# Patient Record
Sex: Female | Born: 1960
Health system: Southern US, Community
[De-identification: ages and names within clinical notes are randomized; demographics above are authoritative.]

## PROBLEM LIST (undated history)

## (undated) DIAGNOSIS — G4730 Sleep apnea, unspecified: Secondary | ICD-10-CM

## (undated) DIAGNOSIS — Z8041 Family history of malignant neoplasm of ovary: Secondary | ICD-10-CM

## (undated) DIAGNOSIS — K589 Irritable bowel syndrome without diarrhea: Secondary | ICD-10-CM

## (undated) DIAGNOSIS — G2581 Restless legs syndrome: Secondary | ICD-10-CM

## (undated) DIAGNOSIS — I803 Phlebitis and thrombophlebitis of lower extremities, unspecified: Secondary | ICD-10-CM

## (undated) DIAGNOSIS — E739 Lactose intolerance, unspecified: Secondary | ICD-10-CM

## (undated) DIAGNOSIS — E7800 Pure hypercholesterolemia, unspecified: Secondary | ICD-10-CM

## (undated) DIAGNOSIS — G43909 Migraine, unspecified, not intractable, without status migrainosus: Secondary | ICD-10-CM

## (undated) DIAGNOSIS — E785 Hyperlipidemia, unspecified: Secondary | ICD-10-CM

## (undated) DIAGNOSIS — Z86718 Personal history of other venous thrombosis and embolism: Secondary | ICD-10-CM

## (undated) HISTORY — DX: Restless legs syndrome: G25.81

## (undated) HISTORY — DX: Family history of malignant neoplasm of ovary: Z80.41

## (undated) HISTORY — DX: Sleep apnea, unspecified: G47.30

## (undated) HISTORY — PX: APPENDECTOMY: SHX54

## (undated) HISTORY — DX: Irritable bowel syndrome, unspecified: K58.9

## (undated) HISTORY — DX: Migraine, unspecified, not intractable, without status migrainosus: G43.909

## (undated) HISTORY — PX: TUBAL LIGATION: SHX77

## (undated) HISTORY — DX: Phlebitis and thrombophlebitis of lower extremities, unspecified: I80.3

## (undated) HISTORY — DX: Personal history of other venous thrombosis and embolism: Z86.718

## (undated) HISTORY — DX: Lactose intolerance, unspecified: E73.9

## (undated) HISTORY — DX: Pure hypercholesterolemia, unspecified: E78.00

## (undated) HISTORY — PX: CHOLECYSTECTOMY: SHX55

## (undated) HISTORY — PX: ABDOMINAL HYSTERECTOMY: SHX81

## (undated) HISTORY — DX: Hyperlipidemia, unspecified: E78.5

---

## 2008-01-12 ENCOUNTER — Ambulatory Visit: Payer: Self-pay | Admitting: Cardiology

## 2008-01-13 ENCOUNTER — Encounter: Payer: Self-pay | Admitting: Cardiology

## 2008-01-13 LAB — CONVERTED CEMR LAB
Hemoglobin, Urine: NEGATIVE
Leukocytes, UA: NEGATIVE
Protein, ur: NEGATIVE mg/dL
Urine Glucose: NEGATIVE mg/dL
Urobilinogen, UA: 0.2 (ref 0.0–1.0)

## 2008-01-16 ENCOUNTER — Ambulatory Visit: Payer: Self-pay | Admitting: Cardiology

## 2008-01-20 ENCOUNTER — Ambulatory Visit: Payer: Self-pay | Admitting: Cardiology

## 2008-02-10 ENCOUNTER — Ambulatory Visit: Payer: Self-pay | Admitting: Cardiology

## 2010-08-05 NOTE — Assessment & Plan Note (Signed)
[name] [name] OFFICE NOTE   [name], [name]                        MRN:          [id number]  DATE:02/10/2008                            DOB:          16-Apr-1960    Primary Care Physician: [name] Mink, MD   HISTORY OF PRESENT ILLNESS:  This is a 50 year old patient of [name] [name]  with a history of sleep apnea and hypercholesterolemia, who presents for  evaluation of an abnormal EKG and history of pedal edema.  The patient  states that back in August 2009, she started developing swelling in her  feet and her ankles, and she saw [name] [name] about this and premature  beats were noted on exam.  Her EKG showed PVCs and PAC.  An echo was  done, the echo was fairly unremarkable with normal LV systolic function  and no major valvular dysfunction.  The patient do not actually feel any  palpitations.  It was also noted on her EKG that she had a relatively  short PR interval and I did see the patient in consultation for this  about a month ago.  Initially, I noted that she did have a short PR  interval on her EKG, however, there is no evidence for pre-excitation.  She has no history of lightheadedness, syncope, or palpitations.  She  has good exercise tolerance, able to walk on flat ground, walk up hills  as well as climb flights of steps with no shortness of breath.  She has  had no chest pain.  We did do a Holter monitor that showed a brief  episode of ventricular bigeminy, otherwise just rare PVCs and PACs.  We  did an exercise treadmill test.  The patient did have excellent exercise  tolerance.  She walked for 10 minutes and 2 seconds, stopped due to  fatigue, and no arrhythmias were noted.  There were no ischemic ST  segment changes.  Since I last her, the patient states that she has been  trying to cut back on her salt and she has actually been able to stop  the Lasix and has had no further leg swelling.  She did not  even need to  start the compression stockings.  Additionally, she has been avoiding  caffeine.   PAST MEDICAL HISTORY:  1. Hypercholesterolemia.  2. Mild obesity.  3. Obstructive sleep apnea.  The patient does have CPAP, however, she      seems to be using only intermittently.  4. Restless leg syndrome.  The patient is on Mirapex.  5. Echo done in August 2009 at Dr. Marliss [name] office that shows EF of      60%, trace mitral regurgitation, trace tricuspid regurgitation,      normal pulmonary artery pressures, normal LV wall motion, no aortic      stenosis.  6. Holter monitor done in October 2009, that showed a brief episode of      ventricular bigeminy, and otherwise rare PVCs and PACs.  7. Exercise treadmill test on January 20, 2008.  ECG showed normal  sinus rhythm with relatively short PR interval.  The patient      exercised for 10 minutes and 2 seconds.  She had good exercise      tolerance and there were no ST-segment changes and no arrhythmias      developed.   Most recent labs from October 2009, show a BNP of 27.   PHYSICAL EXAMINATION:  VITAL SIGNS:  Blood pressure 160, heart rate is  68 and regular, weight is 176 pounds.  GENERAL:  This is a well-developed female in no apparent distress.  NEUROLOGIC:  Alert and oriented x3.  Normal affect.  NECK:  No thyromegaly.  No thyroid nodule.  No JVD.  CARDIOVASCULAR:  Heart regular S1 and S2.  No S3, no S4.  No murmur.  EXTREMITIES:  There is no carotid bruit.  There is no peripheral edema.  There are 2+ dorsalis pedis pulses bilaterally.  ABDOMEN:  Soft and  nontender.  No hepatosplenomegaly.  Normal bowel sounds.   ASSESSMENT AND PLAN:  This is a 50 year old with history of sleep apnea,  hypercholesterolemia, who presented to Cardiology Clinic for evaluation  of an abnormal EKG and lower extremity swelling.  1. Rhythm.  The patient does have a relatively short PR interval on      her EKG.  However, there is no discrete delta  wave and she has had      no episodes of syncope, palpitations, or lightheadedness.      Therefore, I think is unlikely to be Wolff-Parkinson-White      syndrome.  Additionally, the patient had an exercise treadmill test      on which she had good exercise tolerance, had no ischemic changes,      and also developed no arrhythmias.  She did have a Holter monitor      showing a brief ventricular bigeminy as well as some rare premature      ventricular contractions and premature atrial contractions.  There      is really no specific intervention that needs to be taken for this      especially in the setting of a normal left ventricular function on      echocardiography.  I think, at this time, the best thing would be      to avoid caffeine as much as possible as she is doing.  She also      did, of note, have a normal TSH done back in August 2009.  Finally,      the patient's BNP was also low at 27.  2. Peripheral edema.  The patient's peripheral edema has actually      completely resolved.  She is not using any Lasix.  She did have an      echo showing normal left ventricular ejection fraction.  There are      no significant valvular abnormalities, and she did not have      pulmonary hypertension.  Her BNP was 27 suggesting that there is no      volume overload.  I think it may have been just venous      insufficiency that causied the patient's ankle swelling.  She has      been cutting back on the salt in her diet.  If needed in the      future, she can use graded compression stockings.  3. History of hyperlipidemia.  We will try to get faxed over the most      recent lipids done  at Dr. Marliss [name] office back in August to see if      there is any further intervention needed with that.     [name] Ancona, MD  Electronically Signed    DM/MedQ  DD: 02/10/2008  DT: 02/11/2008  Job #: [id number]   cc:   [name] Mink, MD

## 2010-08-05 NOTE — Assessment & Plan Note (Signed)
[facility] OFFICE NOTE   [name] [name], [name] [name]                        MRN:          [id number]  DATE:01/12/2008                            DOB:          1961-01-31    REFERRING PHYSICIAN:  Galen Daft. Guffey, MD   HISTORY OF PRESENT ILLNESS:  This is a 50 year old with history of sleep  apnea and hypercholesterolemia who presents for evaluation of an  abnormal EKG and history of pedal edema.  The patient states that she  was doing well until August 2009 when she began to develop swelling in  her feet and in her ankles.  She came to see Dr. Timoteo Gaul about this and  premature beats were noted on exam.  EKG showed PVCs and PACs and an  echocardiogram was done.  The echo was fairly unremarkable with normal  LV systolic function and no major valvular dysfunction and normal  pulmonary artery pressure.  The patient states that she was not feeling  any palpitations.  It was also noted on her EKG that she did appear to  have a relatively short PR interval.  Since that time, the patient was  started on Lasix for her pedal edema and she states that this has  essentially resolved.  She continues to have premature beats.  However,  she states that she is really completely asymptomatic from this  standpoint.  She has no episodes of lightheadedness.  No episodes of  syncope.  No episodes where she feels that her heart is racing and she  has really never had palpitations.  She has good exercise tolerance.  She is able to walk on flat ground and walk up hills as well as climb a  flight of steps with no shortness of breath.  She has not had any  episodes of chest pain.  She has no orthopnea.  No PND.  One of her  other complaints is that she has occasional bilateral knee aching.  Of  note, she does have a strong family history of rheumatoid arthritis.  However, Dr. Timoteo Gaul has worked her up for collagen vascular disease and  the workup has  essentially been negative so far with negative ANA,  normal sed rate and normal level of rheumatoid factor.   PAST MEDICAL HISTORY:  1. Hypercholesterolemia.  2. Obesity.  3. Obstructive sleep apnea.  The patient does have CPAP; however, she      seems to use it only intermittently.  4. Restless legs syndrome.  The patient is on Mirapex.  5. Echocardiogram done in August 2009 at Dr. Marliss Coots office shows EF      of 60%, trace mitral regurgitation, trace tricuspid regurgitation,      normal pulmonary artery pressures, normal left ventricular wall      motion, no aortic stenosis.   Most recent labs from August 2009 showed a normal TSH, hematocrit 34.9,  platelets 191, potassium 4.1, creatinine 0.7, ANA negative, sed rate 6.  Rheumatoid factor within normal limits.   FAMILY HISTORY:  The patient's mother, sister, and grandmother all have  rheumatoid  arthritis.  Mother additionally has diabetes and  hypertension.  The patient's father has had an episode of what sounds  like atrial fibrillation when he was in his 34s and her sister has  mitral valve prolapse.   SOCIAL HISTORY:  The patient does not smoke.  She has never smoked.  She  does not use alcohol or illicit drugs.  She lives in [place].  She actually  moved down from [place] about 2 years ago.  She works at  [employer].   EKGs were reviewed and EKG from Dr. Marliss Coots office on November 21, 2007  shows relatively short PR interval with PACs and PVCs.  The QRS is  narrow.  There is no evidence for preexcitation.  EKG from December 07, 2007 from Dr. Marliss Coots office shows a relatively short PR interval.  There is no evidence for preexcitation.  There are no premature beats.  EKG today in our office shows a heart rate of 66.  There is a borderline  short PR interval.  There is no evidence of preexcitation with a normal  QRS interval.  The P-waves do appear to be sinus.   PHYSICAL EXAMINATION:  VITAL SIGNS:  Blood pressures  86/64.  Heart rate  is 52 and regular.  Weight is 173 pounds.  GENERAL:  This is a well-developed female in no apparent distress.  NEUROLOGIC:  Alert and oriented x3.  Normal affect.  NECK:  No thyromegaly.  No thyroid nodule.  No JVD.  CARDIOVASCULAR:  Heart regular, S1 and S2.  No S3, S4.  No murmur.  There is no carotid bruit.  EXTREMITIES:  There is no peripheral edema.  There are 2+ dorsalis pedis  pulses bilaterally.  ABDOMEN:  Soft and nontender.  No hepatosplenomegaly.  Normal bowel  sounds.  LUNGS:  Clear to auscultation bilaterally with normal respiratory  effort.  HEENT:  Normal.  SKIN:  Normal.  MUSCULOSKELETAL:  Normal.   ASSESSMENT/PLAN:  This is a 50 year old with a history of obstructive  sleep apnea and hypercholesterolemia who presents to Cardiology clinic  for evaluation of abnormal EKG and lower extremity swelling.  1. EKG.  The patient's EKG was reviewed today and the last 2 EKGs from      her primary care physician's office were reviewed as well.  She      does have a relatively short PR interval.  Her P-waves, however, do      appear to be sinus Ps today in that they are positive in I, II,      III, aVF and are biphasic in V1.  She does not appear to have      preexcitation as her QRS width is normal.  The patient states that      she is really asymptomatic from a rhythm standpoint.  She has not      felt any palpitations.  She has never had any episodes of      lightheadedness, tachycardia, or syncope.  I think at this time it      will be reasonable to obtain a Holter monitor to assess for any      evidence of asymptomatic arrhythmias.  Additionally, I going to set      her up to get an exercise treadmill test.  I would like to see what      her rhythm does as her heart rates speeds up; however, currently I      think that her EKG is probably benign  as she really has not had any      symptoms and though she does have a short PR, she does not have       evidence of preexcitation by widened QRS.  2. Foot and ankle swelling.  The patient did have an echocardiogram      that did appear relatively normal.  I wonder her foot and ankle      swelling might not be venous insufficiency.  I did talk to her at      length about cutting back on the sodium in her diet.  I also gave      her prescription for graded compression stockings which she can      pick up from the pharmacy.  I told her to try the compression      stockings and to try a very low salt diet and then to see if she      can get off the Lasix.  I will also check a BNP today though I do      not expect that it will be elevated.  3. History of hyperlipidemia.  I did not see recent lipids from Dr.      Marliss Coots office, so I will go ahead and draw her lipids today.     Marca Ancona, MD  Electronically Signed    DM/MedQ  DD: 01/12/2008  DT: 01/12/2008  Job #: [id number]   cc:   Galen Daft. Timoteo Gaul, MD

## 2011-04-16 ENCOUNTER — Ambulatory Visit: Payer: Self-pay | Admitting: Gastroenterology

## 2012-02-02 ENCOUNTER — Encounter: Payer: Self-pay | Admitting: Rheumatology

## 2012-02-21 ENCOUNTER — Encounter: Payer: Self-pay | Admitting: Rheumatology

## 2012-03-10 ENCOUNTER — Ambulatory Visit: Payer: Self-pay

## 2012-03-23 ENCOUNTER — Encounter: Payer: Self-pay | Admitting: Rheumatology

## 2012-07-04 ENCOUNTER — Ambulatory Visit: Payer: Self-pay | Admitting: Internal Medicine

## 2013-11-29 ENCOUNTER — Ambulatory Visit: Payer: Self-pay

## 2014-10-09 ENCOUNTER — Other Ambulatory Visit: Payer: Self-pay | Admitting: Internal Medicine

## 2014-10-09 DIAGNOSIS — R109 Unspecified abdominal pain: Secondary | ICD-10-CM

## 2014-10-11 ENCOUNTER — Ambulatory Visit
Admission: RE | Admit: 2014-10-11 | Discharge: 2014-10-11 | Disposition: A | Discharge status: Home / Self Care | Payer: 59 | Source: Ambulatory Visit | Attending: Internal Medicine | Admitting: Internal Medicine

## 2014-10-11 DIAGNOSIS — R109 Unspecified abdominal pain: Secondary | ICD-10-CM | POA: Diagnosis present

## 2014-10-11 DIAGNOSIS — Z9049 Acquired absence of other specified parts of digestive tract: Secondary | ICD-10-CM | POA: Insufficient documentation

## 2014-10-26 ENCOUNTER — Other Ambulatory Visit: Payer: Self-pay | Admitting: Physician Assistant

## 2014-10-26 ENCOUNTER — Ambulatory Visit
Admission: RE | Admit: 2014-10-26 | Discharge: 2014-10-26 | Disposition: A | Discharge status: Home / Self Care | Payer: 59 | Source: Ambulatory Visit | Attending: Physician Assistant | Admitting: Physician Assistant

## 2014-10-26 DIAGNOSIS — R109 Unspecified abdominal pain: Secondary | ICD-10-CM | POA: Diagnosis present

## 2014-10-26 DIAGNOSIS — N200 Calculus of kidney: Secondary | ICD-10-CM | POA: Diagnosis not present

## 2014-10-26 DIAGNOSIS — I878 Other specified disorders of veins: Secondary | ICD-10-CM | POA: Diagnosis not present

## 2016-01-08 ENCOUNTER — Ambulatory Visit: Payer: 59 | Attending: Physician Assistant | Admitting: Physical Therapy

## 2016-01-08 ENCOUNTER — Encounter: Payer: Self-pay | Admitting: Physical Therapy

## 2016-01-08 DIAGNOSIS — R293 Abnormal posture: Secondary | ICD-10-CM | POA: Diagnosis present

## 2016-01-08 DIAGNOSIS — N3946 Mixed incontinence: Secondary | ICD-10-CM | POA: Insufficient documentation

## 2016-01-08 NOTE — Patient Instructions (Addendum)
  Changing water (1.5)  : bladder irritant (3)  ratio:  1.5 (16 floz ) water -->   2 (16 fl oz)  2 (16 floz) sodas + 1 ( 16 floz) tea   ---->   1 soda and 1 tea          ___________  Proper sitting posture (feet under knees on the floor) Pump ankles for blood flow and Get out of the chair every 1hr     ____________  (handout on ergonomic workstation) \   ____________  Breathing with toileting

## 2016-01-09 NOTE — Therapy (Signed)
[place] [facility] [address] [place], [place], [address] Phone: [phone]   Fax:  [phone]  Physical Therapy Evaluation  Patient Details  Name: [name] [name] MRN: [id number] Date of Birth: December 22, 1960 Referring Provider: Carrie Mew  Encounter Date: 01/08/2016    Past Medical History:  Diagnosis Date  . Hx of blood clots    superficial veins (phlebitis) and currently under control   . Hyperlipidemia   . Restless leg syndrome    bilateral but L>R. Currently not taking medication 2/2 upsetting stomach  . Sleep apnea    sleep study/ CPAP fitting pending in 01/2016    Past Surgical History:  Procedure Laterality Date  . ABDOMINAL HYSTERECTOMY    . APPENDECTOMY    . CHOLECYSTECTOMY    . TUBAL LIGATION      There were no vitals filed for this visit.       Subjective Assessment - 01/09/16 2109    Subjective 1) urinary problems: Pt reported urinary incontinence for the past 5 years, with coughing, sneezing, and moving.  Urgency incontinence started 6 months ago which has progressively worsened following a kidney stone. Pt uses toilet all the time when the opportunity is present. Daily fluid intake: Pt drinks ( 3)  8 fl oz  glasses of water, (2)- 16 floz of sodas , (1) - 16 fl oz ice water.  Pt feels she will not need soda as much for energy once she gets her CPAP machine to treat OSA.  Nocturia episodes 2x/ night.  Enuresis 2-3x / week. Upon waking, she has to go immediately to the bathroom before leakage. Reported decreased sensory awareness and urge comes on suddenly.          2) bowel problems:  Hx of IBS-diarrhea (Bristol Stool Type 7)  which as improved with intake of Imodium. Daily movements with Imodium (Type 2). Fecal leakage is no longer a problem.  Hx of vaginal deliveries with 9 lb babies. Episiotomies x 2 and one tear (degree unknown) in the rectum.  Pt has IBS episodes 1-2 x / month with intake Imodium and has  LBP with these epsiodes.      Pertinent History Denied dyspareunia. Hx of multiple abdominal surgeries, perineal injury during L & D, Hx of scar removal during gall bladder removal surgery (pt informed that scars connected kidney to ovary, pt is unsure which side )     Patient Stated Goals decrease leakage             [facility] PT Assessment - 01/09/16 2109      Assessment   Medical Diagnosis mixed incontinence   Referring Provider Carrie Mew     Precautions   Precautions None     Restrictions   Weight Bearing Restrictions No     Observation/Other Assessments   Observations ankles crossed under chair, slumped sitting   Other Surveys  --  PFDI   53 %     Coordination   Gross Motor Movements are Fluid and Coordinated --  chest breathing   Fine Motor Movements are Fluid and Coordinated --  no pelvic floor lift, abs activation w/ cue for  contraction     Palpation   Palpation comment increased scar restriction over abdomen, upper/ lower R abs non soft                   [facility] Adult PT Treatment/Exercise - 01/09/16 2105      Therapeutic Activites    Therapeutic  Activities --  see pt instructions                     PT Long Term Goals - 01/08/16 1019      PT LONG TERM GOAL #1   Title Pt will report no leakage with stretches and kicking a ball with grandchildren in order to increase participate in activities.    Time 12   Period Weeks   Status New     PT LONG TERM GOAL #2   Title Pt will report being able to delay bowel movement getting to the toilet without leakage nor use of Imodium for one day on the weekend across 2 weeks in order to demo improved bowel function   Time 12   Period Weeks   Status New     PT LONG TERM GOAL #3   Title Pt will report being delay urination upon waking and not feel rushed to get to toilet in order to demo improve urinary function   Time 12   Period Weeks   Status New     PT LONG TERM GOAL #4   Title Pt will  be able to report decreased IBS epsiodes from 1-2 to < 1 and decreased LBP  in order to improve ADLs.    Time 12   Period Weeks   Status New     PT LONG TERM GOAL #5   Title Pt will decrease her score on PFDI 53 % from <43  % in order to improve QOL.   Time 12   Period Weeks   Status New               Plan - 01/09/16 2110    Clinical Impression Statement  Pt is a 55 yo female who c/o urinary and  bowel dysfunctions that impact her QOL. Pt's clinical presentations  include abdominal scar restrictions, non-soft abdomen, poor sitting  posture and other functional body mechanics, and dyscoordination of pelvic  floor mm.  Pt demo'd proper pelvic floor activation without accessory  overuse with abdominal muscles following training. Pt was educated on  proper sitting posture and toileting mechanics. Pt voiced understanding.   Plan to assess pelvic floor with internal exam at next session and also start to address abdominal scar adhesions.       Rehab Potential Good   Clinical Impairments Affecting Rehab Potential multiple abdominal surgeries, perineal injury with vaginal deliveries x 2    PT Frequency 1x / week   PT Duration 12 weeks   PT Treatment/Interventions ADLs/Self Care Home Management;Stair training;Functional mobility training;Therapeutic activities;Patient/family education;Neuromuscular re-education;Balance training;Therapeutic exercise;Manual techniques;Manual lymph drainage;Scar mobilization;Taping;Energy conservation   Consulted and Agree with Plan of Care Patient      Patient will benefit from skilled therapeutic intervention in order to improve the following deficits and impairments:  Decreased safety awareness, Decreased range of motion, Decreased strength, Decreased mobility, Postural dysfunction, Decreased scar mobility, Improper body mechanics, Pain, Decreased activity tolerance, Decreased endurance, Decreased coordination  Visit Diagnosis: Mixed  incontinence  Abnormal posture     Problem List There are no active problems to display for this patient.   [name] ,PT, DPT, E-RYT  01/09/2016, 9:11 PM  [place] [facility] [address] [place], [place], [address] Phone: [phone]   Fax:  [phone]  Name: [name] [name] MRN: [id number] Date of Birth: 05-26-1960

## 2016-01-13 ENCOUNTER — Ambulatory Visit: Payer: 59 | Admitting: Physical Therapy

## 2016-01-13 ENCOUNTER — Telehealth: Payer: Self-pay | Admitting: Physical Therapy

## 2016-01-13 DIAGNOSIS — N3946 Mixed incontinence: Secondary | ICD-10-CM | POA: Diagnosis not present

## 2016-01-13 DIAGNOSIS — R293 Abnormal posture: Secondary | ICD-10-CM

## 2016-01-13 NOTE — Telephone Encounter (Signed)
PT called Dr. Doy Hutching office and left a note for his RN re: pt has not heard back from the sleep study clinic and she would benefit from f/u.

## 2016-01-13 NOTE — Therapy (Signed)
[place] MAIN [facility] SERVICES [address] [place], [place], [address] Phone: [phone]   Fax:  [phone]  Physical Therapy Treatment  Patient Details  Name: [name] [name] MRN: [id number] Date of Birth: March 24, 1960 Referring Provider: Carrie Mew  Encounter Date: 01/13/2016      PT End of Session - 01/13/16 1057    Visit Number 2   Number of Visits 12   Date for PT Re-Evaluation 02/28/16   PT Start Time 1008   PT Stop Time 1100   PT Time Calculation (min) 52 min   Activity Tolerance Patient tolerated treatment well;No increased pain   Behavior During Therapy WFL for tasks assessed/performed      Past Medical History:  Diagnosis Date  . Hx of blood clots    superficial veins (phlebitis) and currently under control   . Hyperlipidemia   . Restless leg syndrome    bilateral but L>R. Currently not taking medication 2/2 upsetting stomach  . Sleep apnea    sleep study/ CPAP fitting pending in 01/2016    Past Surgical History:  Procedure Laterality Date  . ABDOMINAL HYSTERECTOMY    . APPENDECTOMY    . CHOLECYSTECTOMY    . TUBAL LIGATION      There were no vitals filed for this visit.      Subjective Assessment - 01/13/16 1012    Subjective Pt reported she feels skeptical about how PT will help.     Pertinent History Denied dyspareunia. Hx of multiple abdominal surgeries, perineal injury during L & D, Hx of scar removal during gall bladder removal surgery (pt informed that scars connected kidney to ovary, pt is unsure which side )     Patient Stated Goals decrease leakage             [facility] PT Assessment - 01/13/16 1052      Palpation   Palpation comment increased scar restriction over abdomen, upper/ lower R abs non soft (increased softness post Tx                  Pelvic Floor Special Questions - 01/13/16 1052    Pelvic Floor Internal Exam pt consented verbally without contraindications   Exam Type  Vaginal   Strength weak squeeze, no lift  achieved 3/5 with pillow under hips   Strength # of reps 3   Strength # of seconds 3           [facility] Adult PT Treatment/Exercise - 01/13/16 1053      Therapeutic Activites    Therapeutic Activities --  see pt instructions     Manual Therapy   Manual therapy comments scar release over abdomen, facilitation of anterior pelvic floor mm                 PT Education - 01/13/16 1057    Education provided Yes   Education Details HEP   Person(s) Educated Patient   Methods Explanation;Demonstration;Tactile cues;Verbal cues;Handout   Comprehension Verbalized understanding;Returned demonstration             PT Long Term Goals - 01/08/16 1019      PT LONG TERM GOAL #1   Title Pt will report no leakage with stretches and kicking a ball with grandchildren in order to increase participate in activities.    Time 12   Period Weeks   Status New     PT LONG TERM GOAL #2   Title Pt will report being able to delay  bowel movement getting to the toilet without leakage nor use of Imodium for one day on the weekend across 2 weeks in order to demo improved bowel function   Time 12   Period Weeks   Status New     PT LONG TERM GOAL #3   Title Pt will report being delay urination upon waking and not feel rushed to get to toilet in order to demo improve urinary function   Time 12   Period Weeks   Status New     PT LONG TERM GOAL #4   Title Pt will be able to report decreased IBS epsiodes from 1-2 to < 1 and decreased LBP  in order to improve ADLs.    Time 12   Period Weeks   Status New     PT LONG TERM GOAL #5   Title Pt will decrease her score on PFDI 53 % from <43  % in order to improve QOL.   Time 12   Period Weeks   Status New               Plan - 01/13/16 1058    Clinical Impression Statement Pt showed increased awareness of pelvic floro contraction following education and training. Pt demo'd  increased pelvic floor  strength with pillow under hips and neuro-reeducation to find pelvic neutral and coordinated activation of pelvic floor. Pt's bladder position appeared in a more cranial position with adjustments and proper pelvic floor coordination. Pt is limited to monthly sessions due to financial restraints. Pt voiced understanding of how PT can be helpful for her pelvic floor issues at the end of the session.       Rehab Potential Good   Clinical Impairments Affecting Rehab Potential multiple abdominal surgeries, perineal injury with vaginal deliveries x 2    PT Frequency 1x / week   PT Duration 12 weeks   PT Treatment/Interventions ADLs/Self Care Home Management;Stair training;Functional mobility training;Therapeutic activities;Patient/family education;Neuromuscular re-education;Balance training;Therapeutic exercise;Manual techniques;Manual lymph drainage;Scar mobilization;Taping;Energy conservation   Consulted and Agree with Plan of Care Patient      Patient will benefit from skilled therapeutic intervention in order to improve the following deficits and impairments:  Decreased safety awareness, Decreased range of motion, Decreased strength, Decreased mobility, Postural dysfunction, Decreased scar mobility, Improper body mechanics, Pain, Decreased activity tolerance, Decreased endurance, Decreased coordination  Visit Diagnosis: Mixed incontinence  Abnormal posture     Problem List There are no active problems to display for this patient.   [name] [name] ,PT, DPT, E-RYT  01/13/2016, 11:06 AM  [place] MAIN [facility] SERVICES [address] [place], [place], [address] Phone: [phone]   Fax:  [phone]  Name: [name] [name] MRN: [id number] Date of Birth: 08/23/1960

## 2016-01-13 NOTE — Patient Instructions (Signed)
Adjust back to find pelvic neutral, less arch in the back before doing these exercises  1) PELVIC FLOOR / KEGEL EXERCISES   Pelvic floor/ Kegel exercises are used to strengthen the muscles in the base of your pelvis that are responsible for supporting your pelvic organs and preventing urine/feces leakage. Based on your therapist's recommendations, they can be performed while standing, sitting, or lying down. Imagine pelvic floor area as a diamond with pelvic landmarks: top =pubic bone, bottom tip=tailbone, sides=sitting bones (ischial tuberosities).    Make yourself aware of this muscle group by using these cues while coordinating your breath:  Inhale, feel pelvic floor diamond area lower like hammock towards your feet and ribcage/belly expanding. Pause. Let the exhale naturally and feel your belly sink, abdominal muscles hugging in around you and you may notice the pelvic diamond draws upward towards your head forming a umbrella shape. Give a squeeze during the exhalation like you are stopping the flow of urine. If you are squeezing the buttock muscles, try to give 50% less effort.   Common Errors:  Breath holding: If you are holding your breath, you may be bearing down against your bladder instead of pulling it up. If you belly bulges up while you are squeezing, you are holding your breath. Be sure to breathe gently in and out while exercising. Counting out loud may help you avoid holding your breath.  Accessory muscle use: You should not see or feel other muscle movement when performing pelvic floor exercises. When done properly, no one can tell that you are performing the exercises. Keep the buttocks, belly and inner thighs relaxed.  Overdoing it: Your muscles can fatigue and stop working for you if you over-exercise. You may actually leak more or feel soreness at the lower abdomen or rectum.  YOUR HOME EXERCISE PROGRAM  LONG HOLDS: Position: on back  WITH PILLOW UNDER HIPS   Inhale and  then exhale. Then squeeze the muscle and count aloud for 10 seconds. Rest with three long breaths. (Be sure to let belly sink in with exhales and not push outward)  Perform 3 repetitions, 3 times/day  (weekends: try to get 4 -5 sets in per day spreadout throughout the day)                 DECREASE DOWNWARD PRESSURE ON  YOUR PELVIC FLOOR, ABDOMINAL, LOW BACK MUSCLES       PRESERVE YOUR PELVIC HEALTH LONG-TERM   ** SQUEEZE pelvic floor BEFORE YOUR SNEEZE, COUGH, LAUGH   ** EXHALE BEFORE YOU RISE AGAINST GRAVITY (lifting, sit to stand, from squat to stand)   ** LOG ROLL OUT OF BED INSTEAD OF CRUNCH/SIT-UP   ___________________   You are now ready to begin training the deep core muscles system: diaphragm, transverse abdominis, pelvic floor . These muscles must work together as a team.           The key to these exercises to train the brain to coordinate the timing of these muscles and to have them turn on for long periods of time to hold you upright against gravity (especially important if you are on your feet all day).These muscles are postural muscles and play a role stabilizing your spine and bodyweight. By doing these repetitions slowly and correctly instead of doing crunches, you will achieve a flatter belly without a lower pooch. You are also placing your spine in a more neutral position and breathing properly which in turn, decreases your risk for problems related to your  pelvic floor, abdominal, and low back such as pelvic organ prolapse, hernias, diastasis recti (separation of superficial muscles), disk herniations, spinal fractures. These exercises set a solid foundation for you to later progress to resistance/ strength training with therabands and weights and return to other typical fitness exercises with a stronger deeper core.   Do level 1 : 10 reps Do level 2: 10 reps (left and right = 1 rep) x 3 sets , 2 x day Do not progress to level 3 for 3-4 weeks. You know you are  ready when you do not have any rocking of pelvis nor arching in your back     3) scar massage (zigzag, 5 min)per night  (fingers are flat)

## 2016-01-27 ENCOUNTER — Encounter: Payer: 59 | Admitting: Physical Therapy

## 2016-02-15 ENCOUNTER — Encounter: Payer: Self-pay | Admitting: Emergency Medicine

## 2016-02-15 ENCOUNTER — Emergency Department: Payer: 59

## 2016-02-15 ENCOUNTER — Emergency Department
Admission: EM | Admit: 2016-02-15 | Discharge: 2016-02-15 | Disposition: A | Discharge status: Home / Self Care | Payer: 59 | Attending: Student in an Organized Health Care Education/Training Program | Admitting: Student in an Organized Health Care Education/Training Program

## 2016-02-15 DIAGNOSIS — J189 Pneumonia, unspecified organism: Secondary | ICD-10-CM | POA: Insufficient documentation

## 2016-02-15 DIAGNOSIS — R05 Cough: Secondary | ICD-10-CM | POA: Diagnosis present

## 2016-02-15 LAB — BASIC METABOLIC PANEL
ANION GAP: 9 (ref 5–15)
BUN: 15 mg/dL (ref 6–20)
CHLORIDE: 105 mmol/L (ref 101–111)
CO2: 25 mmol/L (ref 22–32)
Calcium: 9.1 mg/dL (ref 8.9–10.3)
Creatinine, Ser: 0.87 mg/dL (ref 0.44–1.00)
GFR calc non Af Amer: >60 mL/min (ref >60)
Glucose, Bld: 121 mg/dL — ABNORMAL HIGH (ref 65–99)
POTASSIUM: 3.7 mmol/L (ref 3.5–5.1)
Sodium: 139 mmol/L (ref 135–145)

## 2016-02-15 LAB — CBC WITH DIFFERENTIAL/PLATELET
BASOS ABS: 0 10*3/uL (ref 0–0.1)
BASOS PCT: 1 %
Eosinophils Absolute: 0 10*3/uL (ref 0–0.7)
Eosinophils Relative: 0 %
HEMATOCRIT: 37 % (ref 35.0–47.0)
HEMOGLOBIN: 12.2 g/dL (ref 12.0–16.0)
Lymphocytes Relative: 9 %
Lymphs Abs: 0.5 10*3/uL — ABNORMAL LOW (ref 1.0–3.6)
MCH: 26.2 pg (ref 26.0–34.0)
MCHC: 33 g/dL (ref 32.0–36.0)
MCV: 79.3 fL — ABNORMAL LOW (ref 80.0–100.0)
MONOS PCT: 6 %
Monocytes Absolute: 0.4 10*3/uL (ref 0.2–0.9)
NEUTROS ABS: 5.3 10*3/uL (ref 1.4–6.5)
NEUTROS PCT: 84 %
Platelets: 147 10*3/uL — ABNORMAL LOW (ref 150–440)
RBC: 4.66 MIL/uL (ref 3.80–5.20)
RDW: 15.4 % — ABNORMAL HIGH (ref 11.5–14.5)
WBC: 6.3 10*3/uL (ref 3.6–11.0)

## 2016-02-15 MED ORDER — AZITHROMYCIN 500 MG PO TABS
500.0000 mg | ORAL_TABLET | Freq: Once | ORAL | Status: AC
  Administered 2016-02-15: 500 mg via ORAL
  Filled 2016-02-15: qty 1

## 2016-02-15 MED ORDER — CEFTRIAXONE SODIUM-DEXTROSE 1-3.74 GM-% IV SOLR
INTRAVENOUS | Status: AC
  Administered 2016-02-15: 1000 mg via INTRAVENOUS
  Filled 2016-02-15: qty 50

## 2016-02-15 MED ORDER — AZITHROMYCIN 250 MG PO TABS: 250.0000 mg | ORAL_TABLET | Freq: Every day | ORAL | 0 refills | Status: AC

## 2016-02-15 MED ORDER — BENZONATATE 100 MG PO CAPS: 100.0000 mg | ORAL_CAPSULE | Freq: Three times a day (TID) | ORAL | 0 refills | Status: DC | PRN

## 2016-02-15 MED ORDER — DEXTROSE 5 % IV SOLN: 1.0000 g | Freq: Once | INTRAVENOUS | Status: DC

## 2016-02-15 MED ORDER — CEFTRIAXONE SODIUM-DEXTROSE 1-3.74 GM-% IV SOLR
1.0000 g | Freq: Once | INTRAVENOUS | Status: AC
  Administered 2016-02-15: 1000 mg via INTRAVENOUS

## 2016-02-15 MED ORDER — ALBUTEROL SULFATE HFA 108 (90 BASE) MCG/ACT IN AERS: 2.0000 | INHALATION_SPRAY | Freq: Four times a day (QID) | RESPIRATORY_TRACT | 0 refills | Status: DC | PRN

## 2016-02-15 MED ORDER — IPRATROPIUM-ALBUTEROL 0.5-2.5 (3) MG/3ML IN SOLN
3.0000 mL | Freq: Once | RESPIRATORY_TRACT | Status: AC
  Administered 2016-02-15: 3 mL via RESPIRATORY_TRACT
  Filled 2016-02-15: qty 3

## 2016-02-15 NOTE — ED Triage Notes (Signed)
Cough x 3 days

## 2016-02-15 NOTE — Discharge Instructions (Signed)
You have been diagnosed with a pneumonia. You should take the remainder of your antibiotic as directed. Use the inhaler and take the cough medicine as needed. Return to the ED for acutely worsening symptoms. Rest and hydrate well to promote healing. Follow-up with your provider for continued symptoms.

## 2016-02-15 NOTE — ED Provider Notes (Signed)
[facility] Emergency Department Provider Note ____________________________________________  Time seen: 0848  I have reviewed the triage vital signs and the nursing notes.  HISTORY  Chief Complaint  Cough  HPI [name] [name] is a 55 y.o. female presents to the ED for evaluation of shortness of breath and cough. She describes onset of symptoms about 3 days prior. She also notes a runny nose but describes primary symptoms include chest tightness and shortness of breath. She notes subjective fevers but denies any chills, or sweats. She has taken Mucinex for symptom relief denies significant benefit.  Past Medical History:  Diagnosis Date  . Hx of blood clots    superficial veins (phlebitis) and currently under control   . Hyperlipidemia   . Restless leg syndrome    bilateral but L>R. Currently not taking medication 2/2 upsetting stomach  . Sleep apnea    sleep study/ CPAP fitting pending in 01/2016    There are no active problems to display for this patient.   Past Surgical History:  Procedure Laterality Date  . ABDOMINAL HYSTERECTOMY    . APPENDECTOMY    . CHOLECYSTECTOMY    . TUBAL LIGATION      Prior to Admission medications   Medication Sig Start Date End Date Taking? Authorizing Provider  albuterol (PROVENTIL HFA;VENTOLIN HFA) 108 (90 Base) MCG/ACT inhaler Inhale 2 puffs into the lungs every 6 (six) hours as needed for wheezing or shortness of breath. 02/15/16   Hayde Kilgour V Bacon Telly Broberg, PA-C  atorvastatin (LIPITOR) 10 MG tablet Take 10 mg by mouth daily.    Historical Provider, MD  azithromycin (ZITHROMAX Z-PAK) 250 MG tablet Take 1 tablet (250 mg total) by mouth daily. 02/15/16 02/19/16  Dura Mccormack V Bacon Kenniel Bergsma, PA-C  benzonatate (TESSALON PERLES) 100 MG capsule Take 1 capsule (100 mg total) by mouth 3 (three) times daily as needed for cough (Take 1-2 per dose). 02/15/16   Wandell Scullion V Bacon Jettson Crable, PA-C  Loperamide HCl (IMODIUM PO) Take by mouth.     Historical Provider, MD  LOPERAMIDE HCL PO Take 2 mg by mouth.    Historical Provider, MD  oxybutynin (DITROPAN-XL) 5 MG 24 hr tablet Take 5 mg by mouth at bedtime.    Historical Provider, MD    Allergies Lactose intolerance (gi) and Penicillins  Family History  Problem Relation Age of Onset  . Hypertension Mother   . Diabetes Mother   . Stroke Mother   . Hypertension Father   . COPD Father   . Heart disease Father   . Arthritis Sister   . Heart disease Sister   . Cancer Other     Social History Social History  Substance Use Topics  . Smoking status: Never Smoker  . Smokeless tobacco: Never Used  . Alcohol use No    Review of Systems  Constitutional: Positive for subjective fever. Eyes: Negative for visual changes. ENT: Negative for sore throat. Cardiovascular: Negative for chest pain. Respiratory: Positive for shortness of breath. Gastrointestinal: Negative for abdominal pain, vomiting and diarrhea. Genitourinary: Negative for dysuria. Musculoskeletal: Negative for back pain. Skin: Negative for rash. Neurological: Negative for headaches, focal weakness or numbness. ____________________________________________  PHYSICAL EXAM:  VITAL SIGNS: ED Triage Vitals  Enc Vitals Group     BP 02/15/16 0832 123/88     Pulse Rate 02/15/16 0832 89     Resp 02/15/16 0832 18     Temp 02/15/16 0832 98.2 F (36.8 C)     Temp Source 02/15/16 0832 Oral  SpO2 02/15/16 0832 94 %     Weight 02/15/16 0834 185 lb (83.9 kg)     Height 02/15/16 0834 5\' 4"  (1.626 m)     Head Circumference --      Peak Flow --      Pain Score --      Pain Loc --      Pain Edu? --      Excl. in [place]? --    Constitutional: Alert and oriented. Well appearing and in no distress. Head: Normocephalic and atraumatic. Eyes: Conjunctivae are normal. PERRL. Normal extraocular movements Neck: Supple. No thyromegaly. Hematological/Lymphatic/Immunological: No cervical lymphadenopathy. Cardiovascular:  Normal rate, regular rhythm. Normal distal pulses. Respiratory: Normal respiratory effort. No wheezes/rales/rhonchi. Gastrointestinal: Soft and nontender. No distention. Musculoskeletal: Nontender with normal range of motion in all extremities.  Neurologic:  Normal gait without ataxia. Normal speech and language. No gross focal neurologic deficits are appreciated. Skin:  Skin is warm, dry and intact. No rash noted. Psychiatric: Mood and affect are normal. Patient exhibits appropriate insight and judgment. ____________________________________________   LABS (pertinent positives/negatives) Labs Reviewed  CBC WITH DIFFERENTIAL/PLATELET - Abnormal; Notable for the following:       Result Value   MCV 79.3 (*)    RDW 15.4 (*)    Platelets 147 (*)    Lymphs Abs 0.5 (*)    All other components within normal limits  BASIC METABOLIC PANEL  ____________________________________________   RADIOLOGY  CXR  IMPRESSION: Streaky right perihilar opacity most suggestive of atelectasis, but mild or developing bronchopneumonia difficult to exclude in this clinical setting. No pleural effusion or other acute cardiopulmonary Abnormality.  I, Meeyah Ovitt, Dannielle Karvonen, personally viewed and evaluated these images (plain radiographs) as part of my medical decision making, as well as reviewing the written report by the radiologist. ____________________________________________  PROCEDURES  DuoNeb x 1 Rocephin 1 g IV Azithromycin 500 mg PO ____________________________________________  INITIAL IMPRESSION / ASSESSMENT AND PLAN / ED COURSE  Patient with x-ray finding that is suggestive of atelectasis but concerning for subclinical pneumonia. She'll be discharged with a prescription for azithromycin to dose as the rectum for the remaining 4 days. She is also provided with Ladona Ridgel and an albuterol inhaler for symptomatic relief. She will follow with primary care provider or return to the ED for acute  respiratory distress.  Clinical Course    ____________________________________________  FINAL CLINICAL IMPRESSION(S) / ED DIAGNOSES  Final diagnoses:  Community acquired pneumonia of right lung, unspecified part of lung      Melvenia Needles, PA-C 02/15/16 1632    Merlyn Lot, MD 02/15/16 [phone]

## 2016-02-28 ENCOUNTER — Encounter: Payer: 59 | Admitting: Physical Therapy

## 2016-06-08 DIAGNOSIS — Z86010 Personal history of colonic polyps: Secondary | ICD-10-CM | POA: Insufficient documentation

## 2016-06-08 DIAGNOSIS — Z860101 Personal history of adenomatous and serrated colon polyps: Secondary | ICD-10-CM | POA: Insufficient documentation

## 2016-12-09 ENCOUNTER — Ambulatory Visit
Admission: RE | Admit: 2016-12-09 | Discharge: 2016-12-09 | Disposition: A | Discharge status: Home / Self Care | Payer: 59 | Source: Ambulatory Visit | Attending: Maternal Newborn | Admitting: Maternal Newborn

## 2016-12-09 ENCOUNTER — Encounter: Payer: Self-pay | Admitting: Maternal Newborn

## 2016-12-09 ENCOUNTER — Ambulatory Visit (INDEPENDENT_AMBULATORY_CARE_PROVIDER_SITE_OTHER): Payer: 59 | Admitting: Maternal Newborn

## 2016-12-09 VITALS — BP 82/60 | HR 60 | Ht 64.0 in | Wt 155.0 lb

## 2016-12-09 DIAGNOSIS — M778 Other enthesopathies, not elsewhere classified: Secondary | ICD-10-CM | POA: Insufficient documentation

## 2016-12-09 DIAGNOSIS — Z01419 Encounter for gynecological examination (general) (routine) without abnormal findings: Secondary | ICD-10-CM | POA: Diagnosis not present

## 2016-12-09 DIAGNOSIS — K589 Irritable bowel syndrome without diarrhea: Secondary | ICD-10-CM | POA: Insufficient documentation

## 2016-12-09 DIAGNOSIS — Z1231 Encounter for screening mammogram for malignant neoplasm of breast: Secondary | ICD-10-CM | POA: Insufficient documentation

## 2016-12-09 DIAGNOSIS — M7580 Other shoulder lesions, unspecified shoulder: Secondary | ICD-10-CM

## 2016-12-09 DIAGNOSIS — E785 Hyperlipidemia, unspecified: Secondary | ICD-10-CM | POA: Insufficient documentation

## 2016-12-09 DIAGNOSIS — E039 Hypothyroidism, unspecified: Secondary | ICD-10-CM | POA: Insufficient documentation

## 2016-12-09 DIAGNOSIS — G4730 Sleep apnea, unspecified: Secondary | ICD-10-CM | POA: Insufficient documentation

## 2016-12-09 DIAGNOSIS — G2581 Restless legs syndrome: Secondary | ICD-10-CM | POA: Insufficient documentation

## 2016-12-09 DIAGNOSIS — G43909 Migraine, unspecified, not intractable, without status migrainosus: Secondary | ICD-10-CM | POA: Insufficient documentation

## 2016-12-09 DIAGNOSIS — E739 Lactose intolerance, unspecified: Secondary | ICD-10-CM | POA: Insufficient documentation

## 2016-12-09 NOTE — Progress Notes (Signed)
Gynecology Annual Exam  PCP: Idelle Crouch, MD  Chief Complaint:  Chief Complaint  Patient presents with  . Gynecologic Exam    pain in left breast    History of Present Illness:Patient is a 56 y.o. No obstetric history on file. presents for annual exam. The patient had a sudden onset of pain in her left breast last night which she said was severe. The pain has lessened in intensity but is still present today.  LMP: No LMP recorded. Patient has had a hysterectomy. Postcoital Bleeding: no  The patient is sexually active. She denies dyspareunia.  The patient does perform self breast exams.  There is notable family history of breast or ovarian cancer in her family.  The patient wears seatbelts: yes.   The patient has regular exercise: yes.    The patient denies current symptoms of depression.     Breast ROS: positive for - new pain in left breast, small palpable nodule that has "come and gone in the past" on the left side  negative for - galactorrhea, new or changing breast lumps on right breast, nipple changes or nipple discharge  Review of Systems  Constitutional: Positive for weight loss. Negative for chills and fever.       Patient has been exercising and attending [employer]  HENT: Negative.   Eyes: Negative.   Respiratory: Negative for cough and shortness of breath.   Cardiovascular: Negative for chest pain and palpitations.  Gastrointestinal: Negative for abdominal pain, constipation and diarrhea.  Genitourinary: Negative.   Musculoskeletal: Negative.   Skin: Negative for itching and rash.  Neurological: Negative.   Endo/Heme/Allergies: Negative.   Psychiatric/Behavioral: Negative.   All other systems reviewed and are negative.   Past Medical History:  Past Medical History:  Diagnosis Date  . Hx of blood clots    superficial veins (phlebitis) and currently under control   . Hyperlipidemia   . Restless leg syndrome    bilateral but L>R. Currently not  taking medication 2/2 upsetting stomach  . Sleep apnea    sleep study/ CPAP fitting pending in 01/2016    Past Surgical History:  Past Surgical History:  Procedure Laterality Date  . ABDOMINAL HYSTERECTOMY    . APPENDECTOMY    . CHOLECYSTECTOMY    . TUBAL LIGATION      Gynecologic History:  No LMP recorded. Patient has had a hysterectomy. Last Pap: 03/26/2015 Results were: epithelial cell abnormality and atypical squamous cellularity of undetermined significance (ASCUS)  Last mammogram: 2 years ago  Results were: BI-RAD II Obstetric History: No obstetric history on file.  Family History:  Family History  Problem Relation Age of Onset  . Hypertension Mother   . Diabetes Mother   . Stroke Mother   . Hypertension Father   . COPD Father   . Heart disease Father   . Arthritis Sister   . Heart disease Sister   . Cancer Other     Social History:  Social History   Social History  . Marital status: Married    Spouse name: N/A  . Number of children: N/A  . Years of education: N/A   Occupational History  . Not on file.   Social History Main Topics  . Smoking status: Never Smoker  . Smokeless tobacco: Never Used  . Alcohol use No  . Drug use: No  . Sexual activity: Yes    Birth control/ protection: Surgical   Other Topics Concern  . Not on file  Social History Narrative  . No narrative on file    Allergies:  Allergies  Allergen Reactions  . Lactose Intolerance (Gi) Other (See Comments)    abd pain/gas/bloating  . Penicillins Rash    Medications: Prior to Admission medications   Medication Sig Start Date End Date Taking? Authorizing Provider  albuterol (PROVENTIL HFA;VENTOLIN HFA) 108 (90 Base) MCG/ACT inhaler Inhale 2 puffs into the lungs every 6 (six) hours as needed for wheezing or shortness of breath. 02/15/16   Menshew, Dannielle Karvonen, PA-C  atorvastatin (LIPITOR) 10 MG tablet Take 10 mg by mouth daily.    [provider]  benzonatate  (TESSALON PERLES) 100 MG capsule Take 1 capsule (100 mg total) by mouth 3 (three) times daily as needed for cough (Take 1-2 per dose). 02/15/16   Menshew, Dannielle Karvonen, PA-C  Loperamide HCl (IMODIUM PO) Take by mouth.    [provider]  LOPERAMIDE HCL PO Take 2 mg by mouth.    [provider]  oxybutynin (DITROPAN-XL) 5 MG 24 hr tablet Take 5 mg by mouth at bedtime.    [provider]    Physical Exam  BP (!) 82/60   Pulse 60   Ht 5\' 4"  (1.626 m)   Wt 155 lb (70.3 kg)   BMI 26.61 kg/m   General: NAD HEENT: normocephalic, anicteric Thyroid: no enlargement, no palpable nodules Pulmonary: No increased work of breathing, CTAB Cardiovascular: RRR, distal pulses 2+ Breast: Breast symmetrical, tenderness on left breast at about 3 o'clock position, no palpable nodules or masses on right breast, small palpable nodule on left breast, no skin or nipple retraction present, no nipple discharge.  No axillary or supraclavicular lymphadenopathy. Abdomen: NABS, soft, non-tender, non-distended.  Umbilicus without lesions.  No hepatomegaly, splenomegaly or masses palpable. No evidence of hernia  Genitourinary:  External: Normal external female genitalia.  Normal urethral meatus, normal  Bartholin's and Skene's glands.    Vagina: Normal vaginal mucosa, no evidence of prolapse.    Cervix: Surgically absent  Uterus: Surgically absent  Adnexa: ovaries non-enlarged, no adnexal masses  Rectal: deferred  Lymphatic: no evidence of inguinal lymphadenopathy Extremities: no edema, superficial phlebitis Neurologic: Grossly intact Psychiatric: mood appropriate, affect full   Assessment: 56 y.o. No obstetric history on file. routine annual exam  Plan: Problem List Items Addressed This Visit    Encounter for annual routine gynecological examination - Primary      1) Mammogram - recommend yearly screening mammogram.  Mammogram Was ordered today  2) STI screening was offered and  declined  3) ASCCP guidelines and rational discussed.  Patient opts for yearly screening interval but would be open to changing if this result normal.  4) Osteoporosis  - per USPTF routine screening DEXA at age 37. Patient has had one scan showing osteopenia. - FRAX 10 year major fracture risk 9.6%, 10 year hip fracture risk 0.4% Encouraged calcium supplementation.  5) Routine healthcare maintenance including cholesterol, diabetes screening discussed managed by PCP  6) Colonoscopy done this year.  Screening recommended starting at age 15 for average risk individuals, age 21 for individuals deemed at increased risk (including African Americans) and recommended to continue until age 50.  For patient age 82-85 individualized approach is recommended.  Gold standard screening is via colonoscopy, Cologuard screening is an acceptable alternative for patient unwilling or unable to undergo colonoscopy.  "Colorectal cancer screening for average?risk adults: 2018 guideline update from the American Cancer Society"[place]: A Cancer Journal for Clinicians: Aug 19, 2016   7) Desires to find out more about MyRisk screening and whether her insurance will cover. Will follow-up via telephone with patient.  8) Follow up 1 year for routine annual.  Avel Sensor, CNM 12/09/2016  8:38 AM

## 2016-12-11 LAB — PAP IG AND HPV HIGH-RISK
HPV, high-risk: NEGATIVE
PAP Smear Comment: 0

## 2016-12-17 ENCOUNTER — Telehealth: Payer: Self-pay | Admitting: Maternal Newborn

## 2016-12-17 ENCOUNTER — Inpatient Hospital Stay
Admission: RE | Admit: 2016-12-17 | Discharge: 2016-12-17 | Disposition: A | Discharge status: Home / Self Care | Payer: Self-pay | Source: Ambulatory Visit | Attending: *Deleted | Admitting: *Deleted

## 2016-12-17 ENCOUNTER — Other Ambulatory Visit: Payer: Self-pay | Admitting: *Deleted

## 2016-12-17 DIAGNOSIS — Z9289 Personal history of other medical treatment: Secondary | ICD-10-CM

## 2016-12-17 NOTE — Telephone Encounter (Signed)
Talked to patient this afternoon re: labs, and she is deciding on [facility] testing.

## 2016-12-17 NOTE — Telephone Encounter (Signed)
Pt is returning call about labs . Please advise

## 2018-02-04 ENCOUNTER — Other Ambulatory Visit: Payer: Self-pay | Admitting: Maternal Newborn

## 2018-02-25 ENCOUNTER — Other Ambulatory Visit: Payer: Self-pay | Admitting: Internal Medicine

## 2018-02-25 DIAGNOSIS — R1011 Right upper quadrant pain: Secondary | ICD-10-CM

## 2018-02-28 ENCOUNTER — Ambulatory Visit
Admission: RE | Admit: 2018-02-28 | Discharge: 2018-02-28 | Disposition: A | Discharge status: Home / Self Care | Payer: BLUE CROSS/BLUE SHIELD | Source: Ambulatory Visit | Attending: Internal Medicine | Admitting: Internal Medicine

## 2018-02-28 DIAGNOSIS — R1011 Right upper quadrant pain: Secondary | ICD-10-CM | POA: Diagnosis present

## 2018-02-28 DIAGNOSIS — Z9049 Acquired absence of other specified parts of digestive tract: Secondary | ICD-10-CM | POA: Insufficient documentation

## 2018-03-11 ENCOUNTER — Ambulatory Visit (INDEPENDENT_AMBULATORY_CARE_PROVIDER_SITE_OTHER): Payer: BLUE CROSS/BLUE SHIELD | Admitting: Maternal Newborn

## 2018-03-11 ENCOUNTER — Encounter: Payer: Self-pay | Admitting: Maternal Newborn

## 2018-03-11 VITALS — BP 120/60 | HR 80 | Ht 64.0 in | Wt 166.0 lb

## 2018-03-11 DIAGNOSIS — Z01419 Encounter for gynecological examination (general) (routine) without abnormal findings: Secondary | ICD-10-CM

## 2018-03-11 NOTE — Progress Notes (Signed)
Gynecology Annual Exam  PCP: Idelle Crouch, MD  Chief Complaint:  Chief Complaint  Patient presents with  . Gynecologic Exam  . [employer] Employee    History of Present Illness:Patient is a 57 y.o. [id number] presenting for an annual exam.   LMP: No LMP recorded. Patient has had a hysterectomy. Postcoital Bleeding: no  The patient is sexually active. She does not have dyspareunia.  The patient does perform self breast exams.  There is notable family history of breast or ovarian cancer in her family.  The patient wears seatbelts: yes.   The patient has regular exercise: yes.    The patient does not have current symptoms of depression.     Review of Systems  Constitutional: Negative.   HENT: Positive for congestion.   Eyes: Negative.   Respiratory: Negative for shortness of breath and wheezing.   Cardiovascular: Negative for chest pain and palpitations.  Gastrointestinal: Positive for abdominal pain, diarrhea and nausea.       IBS  Genitourinary: Negative.   Musculoskeletal: Negative.   Skin: Negative.   Neurological: Negative.   Endo/Heme/Allergies: Negative.   Psychiatric/Behavioral: Negative.   All other systems reviewed and are negative.   Past Medical History:  Past Medical History:  Diagnosis Date  . Hx of blood clots    superficial veins (phlebitis) and currently under control   . Hypercholesterolemia   . Hyperlipidemia   . Irritable bowel syndrome (IBS)   . Lactose intolerance   . Migraine   . Phlebitis and thrombophlebitis of lower extremities, unspecified   . Restless leg syndrome    bilateral but L>R. Currently not taking medication 2/2 upsetting stomach  . Sleep apnea    sleep study/ CPAP fitting pending in 01/2016    Past Surgical History:  Past Surgical History:  Procedure Laterality Date  . ABDOMINAL HYSTERECTOMY    . APPENDECTOMY    . CHOLECYSTECTOMY    . TUBAL LIGATION      Gynecologic History:  No LMP recorded. Patient has had a  hysterectomy. Last Pap: 12/09/2016. Results were:  NIL and HR HPV negative  Last mammogram: 12/09/2016. Results were: BI-RADS I   Obstetric History: [id number]  Family History:  Family History  Problem Relation Age of Onset  . Hypertension Mother   . Diabetes Mother        type 2  . Stroke Mother   . Heart disease Mother   . Hypertension Father   . COPD Father   . Heart disease Father   . Emphysema Father   . Arthritis Sister   . Heart disease Sister   . Hypercholesterolemia Sister        x2  . Cancer Other   . Diabetes Maternal Grandmother        type 2  . Heart disease Maternal Grandmother   . Skin cancer Maternal Grandmother   . Cancer Paternal Aunt        colon, ovary(malignant), uterus(malignant)    Social History:  Social History   Socioeconomic History  . Marital status: Married    Spouse name: Not on file  . Number of children: Not on file  . Years of education: Not on file  . Highest education level: Not on file  Occupational History  . Not on file  Social Needs  . Financial resource strain: Not on file  . Food insecurity:    Worry: Not on file    Inability: Not on file  . Transportation needs:  Medical: Not on file    Non-medical: Not on file  Tobacco Use  . Smoking status: Never Smoker  . Smokeless tobacco: Never Used  Substance and Sexual Activity  . Alcohol use: No  . Drug use: No  . Sexual activity: Not Currently    Birth control/protection: Surgical    Comment: Hysterectomy  Lifestyle  . Physical activity:    Days per week: Not on file    Minutes per session: Not on file  . Stress: Not on file  Relationships  . Social connections:    Talks on phone: Not on file    Gets together: Not on file    Attends religious service: Not on file    Active member of club or organization: Not on file    Attends meetings of clubs or organizations: Not on file    Relationship status: Not on file  . Intimate partner violence:    Fear of current or ex  partner: Not on file    Emotionally abused: Not on file    Physically abused: Not on file    Forced sexual activity: Not on file  Other Topics Concern  . Not on file  Social History Narrative  . Not on file    Allergies:  Allergies  Allergen Reactions  . Lactose Intolerance (Gi) Other (See Comments)    abd pain/gas/bloating  . Penicillins Rash    Medications: Prior to Admission medications   Medication Sig Start Date End Date Taking? Authorizing Provider  aspirin EC 81 MG tablet Take 1 tablet by mouth daily.   Yes [provider]  fluticasone (FLONASE) 50 MCG/ACT nasal spray Place into the nose. 12/24/17 12/24/18 Yes [provider]  naproxen sodium (ALEVE) 220 MG tablet Take by mouth.   Yes [provider]  omeprazole (PRILOSEC) 40 MG capsule Take by mouth. 02/25/18 02/25/19 Yes [provider]  SUMAtriptan (IMITREX) 100 MG tablet Take 1 tablet by mouth as needed.   Yes [provider]    Physical Exam Vitals: Blood pressure 120/60, pulse 80, height _0  (1.626 m), weight 166 lb (75.3 kg).  General: NAD HEENT: normocephalic, anicteric Thyroid: no enlargement, no palpable nodules Pulmonary: No increased work of breathing, CTAB Cardiovascular: RRR, mo murmurs, rubs, or gallops Breast: Breasts symmetrical, no tenderness, no palpable nodules or masses, no skin or nipple retraction present, no nipple discharge.  No axillary or supraclavicular lymphadenopathy. Abdomen: Soft, non-tender, non-distended.  Umbilicus without lesions.  No hepatomegaly, splenomegaly or masses palpable. No evidence of hernia  Genitourinary:  External: Normal external female genitalia.  Normal urethral  meatus, normal Bartholin's and Skene's glands.    Vagina: Normal vaginal mucosa, no evidence of prolapse.    Cervix: Surgically absent  Uterus: Surgically absent  Adnexa: ovaries non-enlarged, no adnexal masses  Rectal: deferred  Lymphatic: no evidence of  inguinal lymphadenopathy Extremities: no edema, erythema, or tenderness Neurologic: Grossly intact Psychiatric: mood appropriate, affect full   Assessment: 57 y.o. [id number] annual exam  Plan: Problem List Items Addressed This Visit    None    Visit Diagnoses    Women's annual routine gynecological examination    -  Primary      1) Mammogram - desires bi-annual screening mammogram after shared decision-making.  Mammogram is up to date.  2) STI screening was offered and declined  3) ASCCP guidelines and rationale discussed.  Patient opts for every 3 year screening interval.  4) Osteoporosis  - per USPTF routine screening DEXA at  age 69 Has had a past scan showing osteopenia, taking calcium supplements  5) Routine healthcare maintenance including cholesterol, diabetes screening: managed by PCP  6) Colonoscopy done 07/02/2016.  7) Revisited the topic of genetic screening for family history of ovarian cancer. She declines Myriad genetic screening at this time.  8) Has had some right-sided pain in her abdomen with new nausea. Has had an abdominal ultrasound and will follow up with PCP.  9) Follow up 1 year for annual exam.  Avel Sensor, CNM 03/11/2018  1:56 PM

## 2018-03-21 ENCOUNTER — Other Ambulatory Visit: Payer: Self-pay | Admitting: Internal Medicine

## 2018-03-21 DIAGNOSIS — K760 Fatty (change of) liver, not elsewhere classified: Secondary | ICD-10-CM

## 2018-03-21 DIAGNOSIS — R1011 Right upper quadrant pain: Secondary | ICD-10-CM

## 2018-03-30 ENCOUNTER — Ambulatory Visit
Admission: RE | Admit: 2018-03-30 | Discharge: 2018-03-30 | Disposition: A | Discharge status: Home / Self Care | Payer: Managed Care, Other (non HMO) | Source: Ambulatory Visit | Attending: Internal Medicine | Admitting: Internal Medicine

## 2018-03-30 DIAGNOSIS — K760 Fatty (change of) liver, not elsewhere classified: Secondary | ICD-10-CM | POA: Diagnosis present

## 2018-03-30 DIAGNOSIS — R1011 Right upper quadrant pain: Secondary | ICD-10-CM | POA: Diagnosis not present

## 2018-03-30 MED ORDER — IOPAMIDOL (ISOVUE-300) INJECTION 61%
100.0000 mL | Freq: Once | INTRAVENOUS | Status: AC | PRN
  Administered 2018-03-30: 100 mL via INTRAVENOUS

## 2019-05-30 DIAGNOSIS — Z20822 Contact with and (suspected) exposure to covid-19: Secondary | ICD-10-CM | POA: Diagnosis not present

## 2019-07-11 DIAGNOSIS — G4733 Obstructive sleep apnea (adult) (pediatric): Secondary | ICD-10-CM | POA: Diagnosis not present

## 2019-07-22 DIAGNOSIS — Z1371 Encounter for nonprocreative screening for genetic disease carrier status: Secondary | ICD-10-CM

## 2019-07-22 HISTORY — DX: Encounter for nonprocreative screening for genetic disease carrier status: Z13.71

## 2019-07-27 NOTE — Progress Notes (Signed)
PCP: Idelle Crouch, MD   Chief Complaint  Patient presents with  . Gynecologic Exam  . [employer] Employee    HPI:      [name] is a 59 y.o. [phone] whose LMP was No LMP recorded. Patient has had a hysterectomy., presents today for her annual examination.  Her menses are absent due to TAH for leio/AUB/pelvic pain. She does not have PMB. She does have occas vasomotor sx.   Sex activity: not sexually active. She does not have vaginal dryness.  Last Pap: 12/09/16  Results were: no abnormalities /neg HPV DNA.   Last mammogram: 12/09/16  Results were: normal--routine follow-up in 12 months There is no FH of breast cancer. There is a FH of ovarian cancer in her pat aunt. There is also a FH of uterine, colon and brain cancer in her pat aunts. Pt has declined genetic testing in past. The patient does do self-breast exams.  Colonoscopy: 2018; Repeat due after 5 years per pt. Followed by PCP.   Tobacco use: The patient denies current or previous tobacco use. Alcohol use: none  No drug use Exercise: moderately active  She does not get adequate calcium and Vitamin D in her diet.  Labs with PCP.   Past Medical History:  Diagnosis Date  . Hx of blood clots    superficial veins (phlebitis) and currently under control   . Hypercholesterolemia   . Hyperlipidemia   . Irritable bowel syndrome (IBS)   . Lactose intolerance   . Migraine   . Phlebitis and thrombophlebitis of lower extremities, unspecified   . Restless leg syndrome    bilateral but L>R. Currently not taking medication 2/2 upsetting stomach  . Sleep apnea    sleep study/ CPAP fitting pending in 01/2016    Past Surgical History:  Procedure Laterality Date  . ABDOMINAL HYSTERECTOMY    . APPENDECTOMY    . CHOLECYSTECTOMY    . TUBAL LIGATION      Family History  Problem Relation Age of Onset  . Hypertension Mother   . Diabetes Mother        type 2  . Stroke Mother   . Heart disease Mother   .  Hypertension Father   . COPD Father   . Heart disease Father   . Emphysema Father   . Arthritis Sister   . Heart disease Sister   . Hypercholesterolemia Sister        x2  . Emphysema Sister   . Colitis Sister   . Cancer Other        testicular  . Lymphoma Other   . Diabetes Maternal Grandmother        type 2  . Heart disease Maternal Grandmother   . Skin cancer Maternal Grandmother   . Ovarian cancer Paternal Aunt        not sure of age  . Uterine cancer Paternal Aunt        not sure of age  . Colon cancer Paternal Aunt        not sure  . Brain cancer Paternal Aunt     Social History   Socioeconomic History  . Marital status: Married    Spouse name: Not on file  . Number of children: Not on file  . Years of education: Not on file  . Highest education level: Not on file  Occupational History  . Not on file  Tobacco Use  . Smoking status: Never Smoker  . Smokeless tobacco:  Never Used  Substance and Sexual Activity  . Alcohol use: No  . Drug use: No  . Sexual activity: Not Currently    Birth control/protection: Surgical    Comment: Hysterectomy  Other Topics Concern  . Not on file  Social History Narrative  . Not on file   Social Determinants of Health   Financial Resource Strain:   . Difficulty of Paying Living Expenses:   Food Insecurity:   . Worried About Charity fundraiser in the Last Year:   . Arboriculturist in the Last Year:   Transportation Needs:   . Film/video editor (Medical):   Marland Kitchen Lack of Transportation (Non-Medical):   Physical Activity:   . Days of Exercise per Week:   . Minutes of Exercise per Session:   Stress:   . Feeling of Stress :   Social Connections:   . Frequency of Communication with Friends and Family:   . Frequency of Social Gatherings with Friends and Family:   . Attends Religious Services:   . Active Member of Clubs or Organizations:   . Attends Archivist Meetings:   Marland Kitchen Marital Status:   Intimate Partner  Violence:   . Fear of Current or Ex-Partner:   . Emotionally Abused:   Marland Kitchen Physically Abused:   . Sexually Abused:      Current Outpatient Medications:  Marland Kitchen  Multiple Vitamin (MULTI-VITAMIN DAILY PO), Take by mouth., Disp: , Rfl:      ROS:  Review of Systems  Constitutional: Negative for fatigue, fever and unexpected weight change.  Respiratory: Negative for cough, shortness of breath and wheezing.   Cardiovascular: Negative for chest pain, palpitations and leg swelling.  Gastrointestinal: Negative for blood in stool, constipation, diarrhea, nausea and vomiting.  Endocrine: Negative for cold intolerance, heat intolerance and polyuria.  Genitourinary: Negative for dyspareunia, dysuria, flank pain, frequency, genital sores, hematuria, menstrual problem, pelvic pain, urgency, vaginal bleeding, vaginal discharge and vaginal pain.  Musculoskeletal: Negative for back pain, joint swelling and myalgias.  Skin: Negative for rash.  Neurological: Negative for dizziness, syncope, light-headedness, numbness and headaches.  Hematological: Negative for adenopathy.  Psychiatric/Behavioral: Negative for agitation, confusion, sleep disturbance and suicidal ideas. The patient is not nervous/anxious.    BREAST: No symptoms    Objective: BP 110/60   Ht 5\' 4"  (1.626 m)   Wt 182 lb (82.6 kg)   BMI 31.24 kg/m    Physical Exam Constitutional:      Appearance: She is well-developed.  Genitourinary:     Vulva, vagina, right adnexa and left adnexa normal.     No vaginal discharge, erythema or tenderness.     Cervix is absent.     Uterus is absent.     No right or left adnexal mass present.     Right adnexa not tender.     Left adnexa not tender.     Genitourinary Comments: UTERUS/CX SURG REM  Neck:     Thyroid: No thyromegaly.  Cardiovascular:     Rate and Rhythm: Normal rate and regular rhythm.     Heart sounds: Normal heart sounds. No murmur.  Pulmonary:     Effort: Pulmonary effort is  normal.     Breath sounds: Normal breath sounds.  Chest:     Breasts:        Right: No mass, nipple discharge, skin change or tenderness.        Left: No mass, nipple discharge, skin change or tenderness.  Abdominal:  Palpations: Abdomen is soft.     Tenderness: There is no abdominal tenderness. There is no guarding.  Musculoskeletal:        General: Normal range of motion.     Cervical back: Normal range of motion.  Neurological:     General: No focal deficit present.     Mental Status: She is alert and oriented to person, place, and time.     Cranial Nerves: No cranial nerve deficit.  Skin:    General: Skin is warm and dry.  Psychiatric:        Mood and Affect: Mood normal.        Behavior: Behavior normal.        Thought Content: Thought content normal.        Judgment: Judgment normal.  Vitals reviewed.     Assessment/Plan:  Encounter for annual routine gynecological examination  Encounter for screening mammogram for malignant neoplasm of breast - Plan: MM 3D SCREEN BREAST BILATERAL; pt to sched mammo  Family history of ovarian cancer--MyRisk testing discussed and done today. Will f/u with results.           GYN counsel breast self exam, mammography screening, menopause, adequate intake of calcium and vitamin D, diet and exercise    F/U  Return in about 1 year (around 07/27/2020).  Flannery Cavallero B. Lunell Robart, PA-C 07/28/2019 2:42 PM

## 2019-07-28 ENCOUNTER — Encounter: Payer: Self-pay | Admitting: Obstetrics and Gynecology

## 2019-07-28 ENCOUNTER — Other Ambulatory Visit: Payer: Self-pay

## 2019-07-28 ENCOUNTER — Ambulatory Visit (INDEPENDENT_AMBULATORY_CARE_PROVIDER_SITE_OTHER): Payer: BLUE CROSS/BLUE SHIELD | Admitting: Obstetrics and Gynecology

## 2019-07-28 VITALS — BP 110/60 | Ht 64.0 in | Wt 182.0 lb

## 2019-07-28 DIAGNOSIS — Z800 Family history of malignant neoplasm of digestive organs: Secondary | ICD-10-CM | POA: Diagnosis not present

## 2019-07-28 DIAGNOSIS — Z1231 Encounter for screening mammogram for malignant neoplasm of breast: Secondary | ICD-10-CM | POA: Diagnosis not present

## 2019-07-28 DIAGNOSIS — Z01419 Encounter for gynecological examination (general) (routine) without abnormal findings: Secondary | ICD-10-CM

## 2019-07-28 DIAGNOSIS — Z8041 Family history of malignant neoplasm of ovary: Secondary | ICD-10-CM

## 2019-07-28 DIAGNOSIS — Z808 Family history of malignant neoplasm of other organs or systems: Secondary | ICD-10-CM | POA: Diagnosis not present

## 2019-07-28 DIAGNOSIS — Z8049 Family history of malignant neoplasm of other genital organs: Secondary | ICD-10-CM | POA: Diagnosis not present

## 2019-07-28 NOTE — Patient Instructions (Addendum)
I value your feedback and entrusting us with your care. If you get a [facility] patient survey, I would appreciate you taking the time to let us know about your experience today. Thank you! ° °As of March 02, 2019, your lab results will be released to your MyChart immediately, before I even have a chance to see them. Please give me time to review them and contact you if there are any abnormalities. Thank you for your patience.  ° °[facility]: [phone] ° ° ° °

## 2019-08-11 ENCOUNTER — Ambulatory Visit
Admission: RE | Admit: 2019-08-11 | Discharge: 2019-08-11 | Disposition: A | Discharge status: Home / Self Care | Payer: BC Managed Care – PPO | Source: Ambulatory Visit | Attending: Obstetrics and Gynecology | Admitting: Obstetrics and Gynecology

## 2019-08-11 DIAGNOSIS — Z1231 Encounter for screening mammogram for malignant neoplasm of breast: Secondary | ICD-10-CM

## 2019-08-12 ENCOUNTER — Encounter: Payer: Self-pay | Admitting: Obstetrics and Gynecology

## 2019-08-15 ENCOUNTER — Encounter: Payer: Self-pay | Admitting: Obstetrics and Gynecology

## 2019-09-06 ENCOUNTER — Telehealth: Payer: Self-pay | Admitting: Obstetrics and Gynecology

## 2019-09-06 NOTE — Telephone Encounter (Signed)
Pt aware of neg MyRisk results. IBIS=5.7%/riskscore=5.6%. No further screening indicated.   Patient understands these results only apply to her and her children, and this is not indicative of genetic testing results of her other family members. It is recommended that her other family members have genetic testing done.  Pt also understands negative genetic testing doesn't mean she will never get any of these cancers.   Hard copy mailed to pt. F/u prn.

## 2019-11-02 ENCOUNTER — Other Ambulatory Visit: Payer: Self-pay | Admitting: Internal Medicine

## 2019-11-02 DIAGNOSIS — E039 Hypothyroidism, unspecified: Secondary | ICD-10-CM | POA: Diagnosis not present

## 2019-11-02 DIAGNOSIS — G2581 Restless legs syndrome: Secondary | ICD-10-CM | POA: Diagnosis not present

## 2019-11-02 DIAGNOSIS — Z79899 Other long term (current) drug therapy: Secondary | ICD-10-CM | POA: Diagnosis not present

## 2019-11-02 DIAGNOSIS — M7989 Other specified soft tissue disorders: Secondary | ICD-10-CM

## 2019-11-02 DIAGNOSIS — E782 Mixed hyperlipidemia: Secondary | ICD-10-CM | POA: Diagnosis not present

## 2019-11-02 DIAGNOSIS — Z0000 Encounter for general adult medical examination without abnormal findings: Secondary | ICD-10-CM | POA: Diagnosis not present

## 2019-11-13 ENCOUNTER — Ambulatory Visit
Admission: RE | Admit: 2019-11-13 | Discharge: 2019-11-13 | Disposition: A | Discharge status: Home / Self Care | Payer: BC Managed Care – PPO | Source: Ambulatory Visit | Attending: Internal Medicine | Admitting: Internal Medicine

## 2019-11-13 ENCOUNTER — Other Ambulatory Visit: Payer: Self-pay

## 2019-11-13 DIAGNOSIS — M7989 Other specified soft tissue disorders: Secondary | ICD-10-CM | POA: Insufficient documentation

## 2019-11-13 DIAGNOSIS — R600 Localized edema: Secondary | ICD-10-CM | POA: Diagnosis not present

## 2019-11-13 DIAGNOSIS — M79605 Pain in left leg: Secondary | ICD-10-CM | POA: Diagnosis not present

## 2019-12-04 DIAGNOSIS — G4733 Obstructive sleep apnea (adult) (pediatric): Secondary | ICD-10-CM | POA: Diagnosis not present

## 2020-01-26 DIAGNOSIS — E782 Mixed hyperlipidemia: Secondary | ICD-10-CM | POA: Diagnosis not present

## 2020-03-03 DIAGNOSIS — J0100 Acute maxillary sinusitis, unspecified: Secondary | ICD-10-CM | POA: Diagnosis not present

## 2020-08-19 IMAGING — US US ABDOMEN COMPLETE
1 series · 13 of 25 positions shown · non-contrast
Comparison: CT abdomen and pelvis 10/26/2014

CLINICAL DATA: RIGHT upper quadrant abdominal pain for 3 months

EXAM:
ABDOMEN ULTRASOUND COMPLETE

[Series 1: us abdomen complete · 13 of 93 slices shown]
[im 1/93]
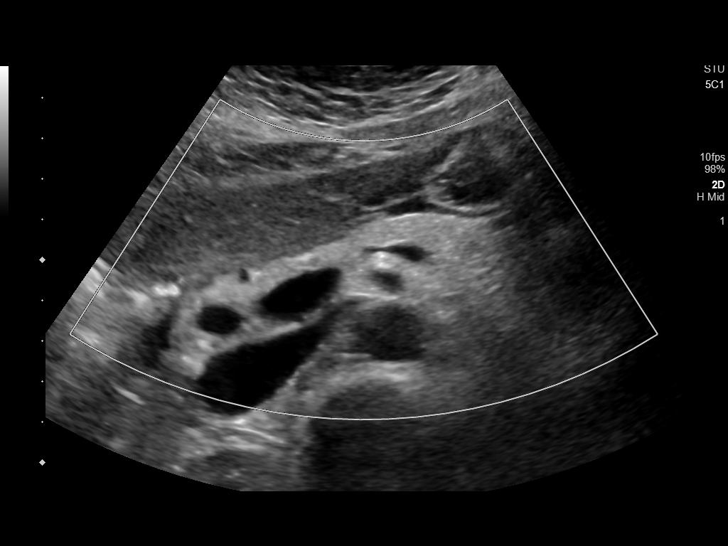
[im 8/93]
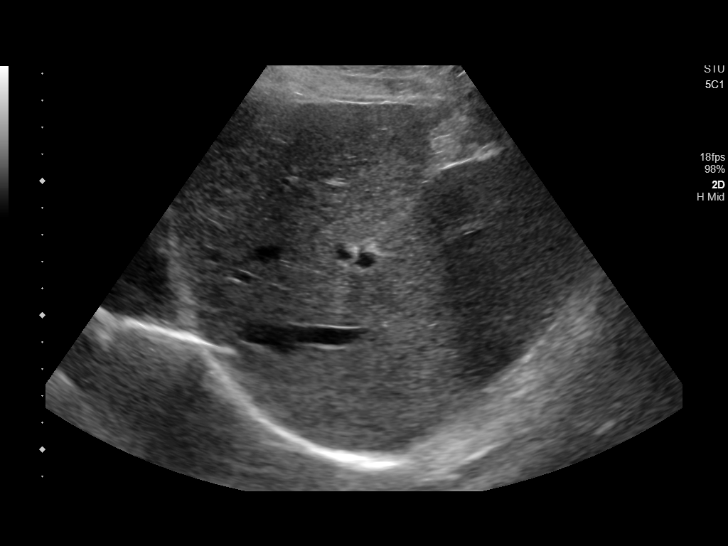
[im 16/93]
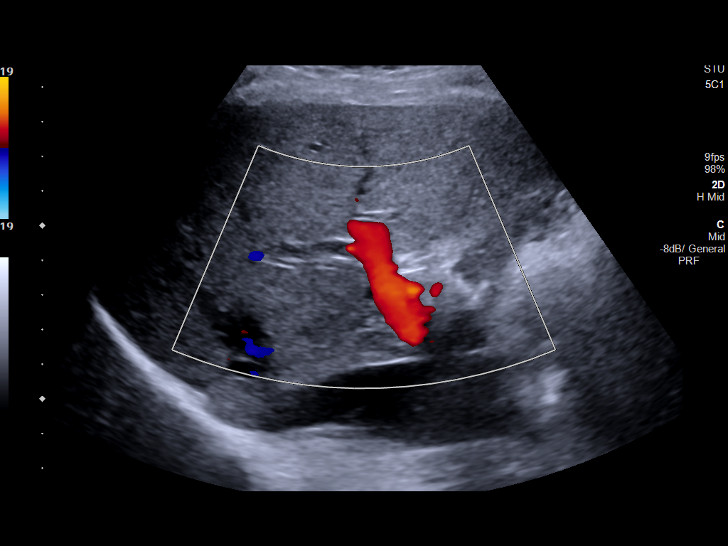
[im 24/93]
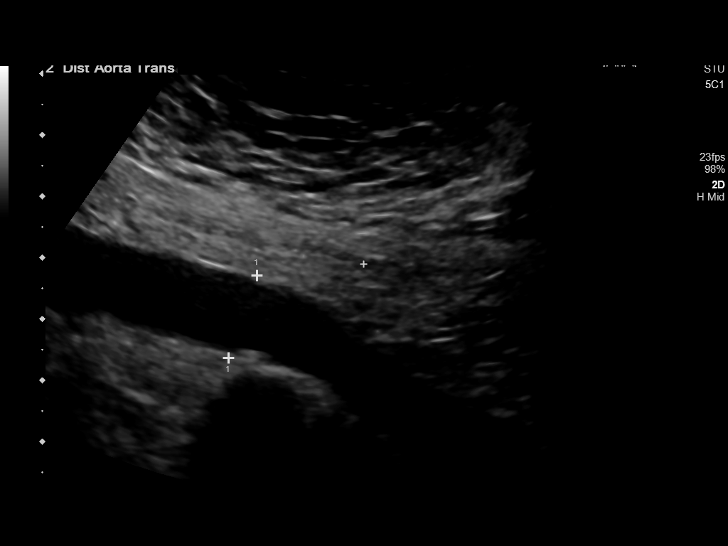
[im 31/93]
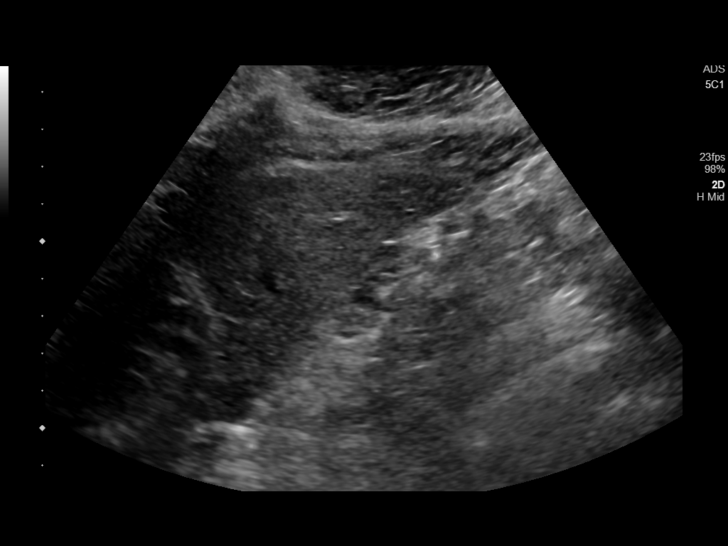
[im 39/93]
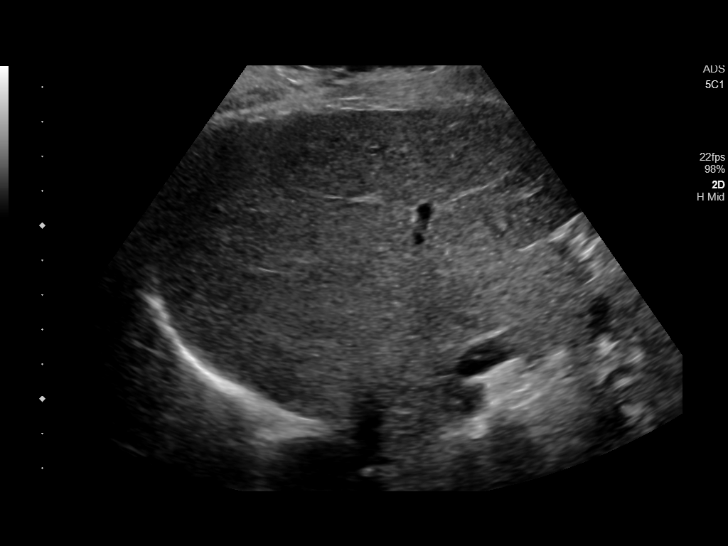
[im 47/93]
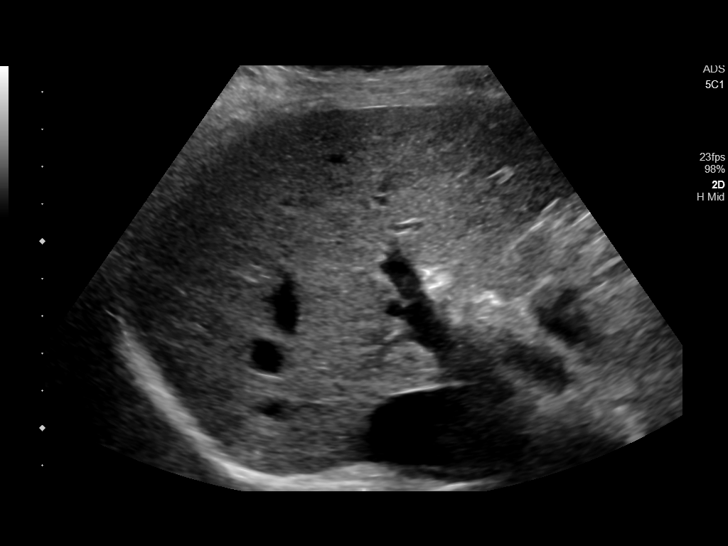
[im 54/93]
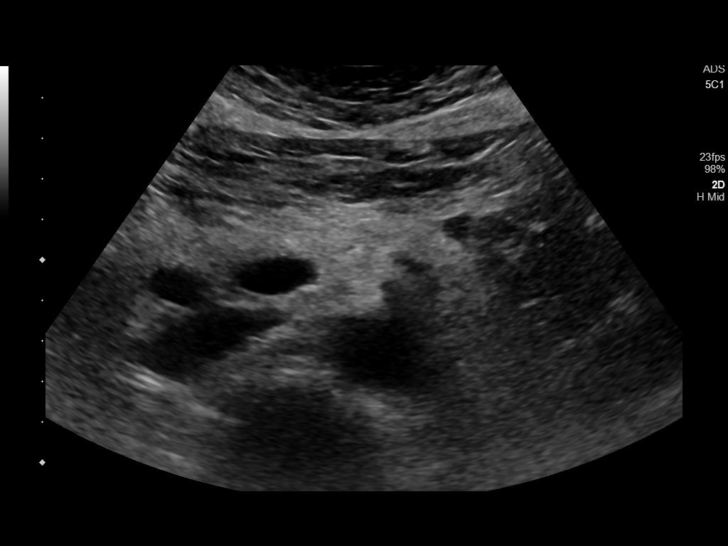
[im 62/93]
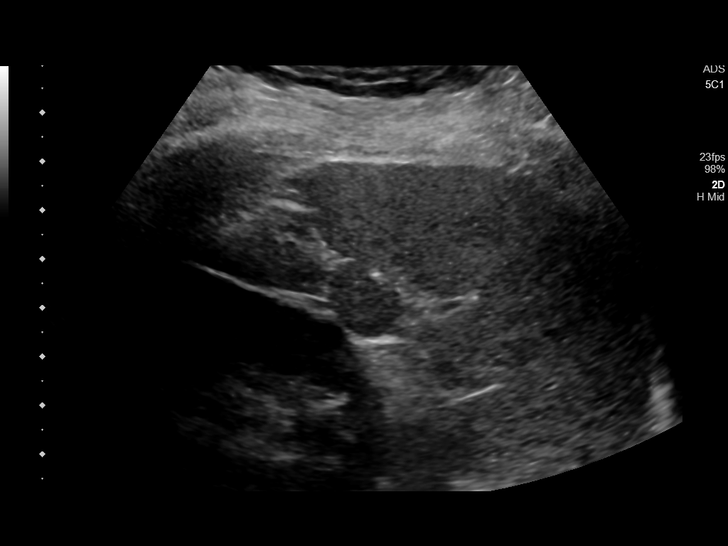
[im 70/93]
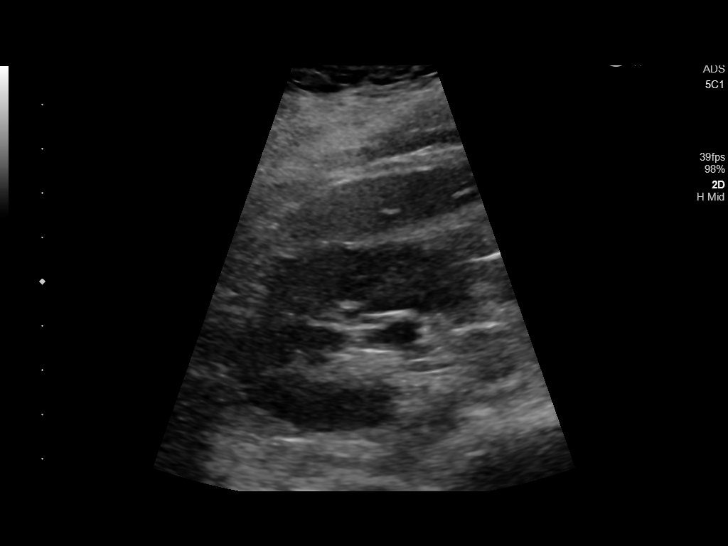
[im 77/93]
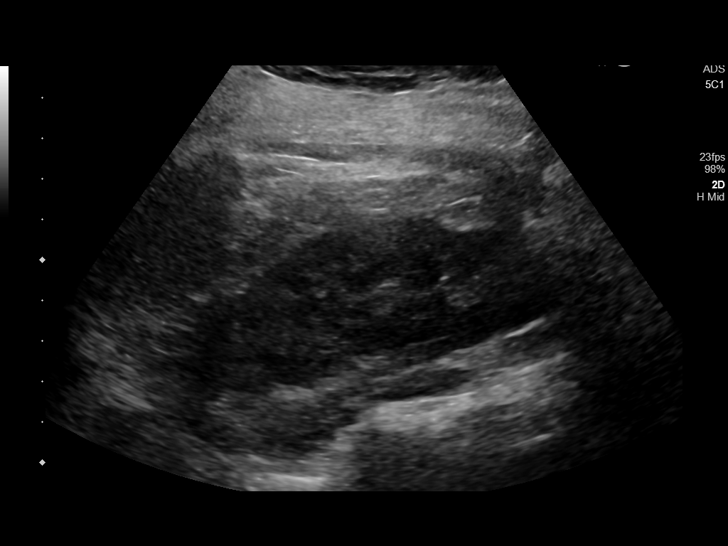
[im 85/93]
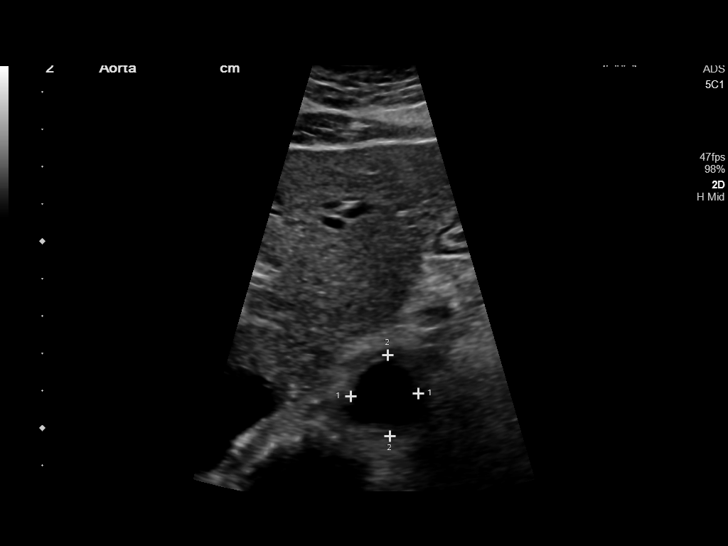
[im 93/93]
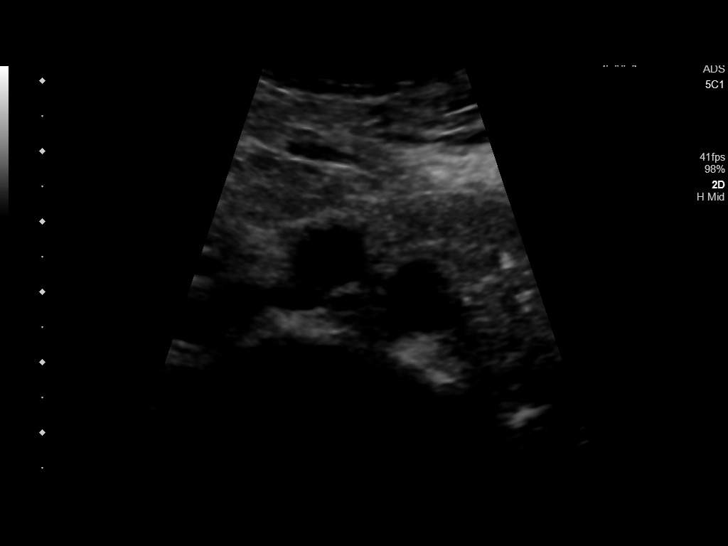

[13 of 25 positions shown; findings below may reference images not displayed]

FINDINGS: Gallbladder: Surgically absent

Common bile duct: Diameter: 7 mm diameter, normal post
cholecystectomy

Liver: Echogenic parenchyma versus RIGHT renal echogenicity, likely
representing fatty infiltration though this can be seen with
cirrhosis and certain infiltrative disorders. No focal hepatic mass
or nodularity. Minimal central intrahepatic biliary dilatation which
can be physiologic post cholecystectomy. Portal vein is patent on
color Doppler imaging with normal direction of blood flow towards
the liver.

IVC: Normal appearance

Pancreas: Normal appearance

Spleen: Normal appearance.  Probable small splenule at splenic hilum

Right Kidney: Length: 10.8 cm. Normal morphology without mass or
hydronephrosis.

Left Kidney: Length: 10.1 cm. Normal morphology without mass or
hydronephrosis.

Abdominal aorta: Normal caliber

Other findings: No free fluid
IMPRESSION: Probable mild fatty infiltration of liver as above.

Minimal central intrahepatic biliary dilatation post
cholecystectomy, can be normal, recommend correlation with LFTs.

Remainder of exam unremarkable.

## 2020-11-18 NOTE — Progress Notes (Signed)
PCP: Idelle Crouch, MD   Chief Complaint  Patient presents with   Gynecologic Exam    HPI:      [name] is a 60 y.o. [phone] whose LMP was No LMP recorded. Patient has had a hysterectomy., presents today for her annual examination.  Her menses are absent due to TAH for leio/AUB/pelvic pain. She does not have PMB. She does not have vasomotor sx.   Sex activity: not sexually active. She does not have vaginal dryness.  Last Pap: 12/09/16  Results were: no abnormalities /neg HPV DNA.   Last mammogram: 08/11/19 Results were: normal--routine follow-up in 12 months There is no FH of breast cancer. There is a FH of ovarian cancer in her pat aunt. There is also a FH of uterine, colon and brain cancer in her pat aunts. Pt is MyRisk neg 5/21; IBIS=5.6%. The patient does self-breast exams. Has occas pain under RT breast along ribs lasting a few days, no masses. Sx intermittent. Has tried changing underwire bras but always wears underwire.  Colonoscopy: 2018; Repeat due after 5 years per pt. Followed by PCP.   Tobacco use: The patient denies current or previous tobacco use. Alcohol use: none  No drug use Exercise: moderately active  She does not get adequate calcium and Vitamin D in her diet.  Labs with PCP.   Past Medical History:  Diagnosis Date   BRCA negative 07/2019   MyRisk neg; IBIS=5.6%   Family history of ovarian cancer    Hx of blood clots    superficial veins (phlebitis) and currently under control    Hypercholesterolemia    Hyperlipidemia    Irritable bowel syndrome (IBS)    Lactose intolerance    Migraine    Phlebitis and thrombophlebitis of lower extremities, unspecified    Restless leg syndrome    bilateral but L>R. Currently not taking medication 2/2 upsetting stomach   Sleep apnea    sleep study/ CPAP fitting pending in 01/2016    Past Surgical History:  Procedure Laterality Date   ABDOMINAL HYSTERECTOMY     APPENDECTOMY     CHOLECYSTECTOMY      TUBAL LIGATION      Family History  Problem Relation Age of Onset   Hypertension Mother    Diabetes Mother        type 2   Stroke Mother    Heart disease Mother    Hypertension Father    COPD Father    Heart disease Father    Emphysema Father    Arthritis Sister    Heart disease Sister    Hypercholesterolemia Sister        x2   Emphysema Sister    Colitis Sister    Cancer Other        testicular   Lymphoma Other    Diabetes Maternal Grandmother        type 2   Heart disease Maternal Grandmother    Skin cancer Maternal Grandmother    Ovarian cancer Paternal Aunt        not sure of age   Uterine cancer Paternal 53        not sure of age   Colon cancer Paternal 109        not sure   Brain cancer Paternal Aunt     Social History   Socioeconomic History   Marital status: Married    Spouse name: Not on file   Number of children: Not on file  Years of education: Not on file   Highest education level: Not on file  Occupational History   Not on file  Tobacco Use   Smoking status: Never   Smokeless tobacco: Never  Vaping Use   Vaping Use: Never used  Substance and Sexual Activity   Alcohol use: No   Drug use: No   Sexual activity: Not Currently    Birth control/protection: Surgical    Comment: Hysterectomy  Other Topics Concern   Not on file  Social History Narrative   Not on file   Social Determinants of Health   Financial Resource Strain: Not on file  Food Insecurity: Not on file  Transportation Needs: Not on file  Physical Activity: Not on file  Stress: Not on file  Social Connections: Not on file  Intimate Partner Violence: Not on file     Current Outpatient Medications:    Multiple Vitamin (MULTI-VITAMIN DAILY PO), Take by mouth., Disp: , Rfl:    rosuvastatin (CRESTOR) 10 MG tablet, Take 1 tablet by mouth daily., Disp: , Rfl:      ROS:  Review of Systems  Constitutional:  Negative for fatigue, fever and unexpected weight change.   Respiratory:  Negative for cough, shortness of breath and wheezing.   Cardiovascular:  Negative for chest pain, palpitations and leg swelling.  Gastrointestinal:  Negative for blood in stool, constipation, diarrhea, nausea and vomiting.  Endocrine: Negative for cold intolerance, heat intolerance and polyuria.  Genitourinary:  Negative for dyspareunia, dysuria, flank pain, frequency, genital sores, hematuria, menstrual problem, pelvic pain, urgency, vaginal bleeding, vaginal discharge and vaginal pain.  Musculoskeletal:  Negative for back pain, joint swelling and myalgias.  Skin:  Negative for rash.  Neurological:  Negative for dizziness, syncope, light-headedness, numbness and headaches.  Hematological:  Negative for adenopathy.  Psychiatric/Behavioral:  Negative for agitation, confusion, sleep disturbance and suicidal ideas. The patient is not nervous/anxious.   BREAST: No symptoms    Objective: BP 120/72   Ht '5\' 4"'  (1.626 m)   Wt 171 lb 6.4 oz (77.7 kg)   BMI 29.42 kg/m    Physical Exam Constitutional:      Appearance: She is well-developed.  Genitourinary:     Vulva normal.     Genitourinary Comments: UTERUS/CX SURG REM     Right Labia: No rash, tenderness or lesions.    Left Labia: No tenderness, lesions or rash.    Vaginal cuff intact.    No vaginal discharge, erythema or tenderness.      Right Adnexa: not tender and no mass present.    Left Adnexa: not tender and no mass present.    Cervix is absent.     Uterus is absent.  Breasts:    Right: No mass, nipple discharge, skin change or tenderness.     Left: No mass, nipple discharge, skin change or tenderness.  Neck:     Thyroid: No thyromegaly.  Cardiovascular:     Rate and Rhythm: Normal rate and regular rhythm.     Heart sounds: Normal heart sounds. No murmur heard. Pulmonary:     Effort: Pulmonary effort is normal.     Breath sounds: Normal breath sounds.  Chest:    Abdominal:     Palpations: Abdomen is  soft.     Tenderness: There is no abdominal tenderness. There is no guarding.  Musculoskeletal:        General: Normal range of motion.     Cervical back: Normal range of motion.  Neurological:  General: No focal deficit present.     Mental Status: She is alert and oriented to person, place, and time.     Cranial Nerves: No cranial nerve deficit.  Skin:    General: Skin is warm and dry.  Psychiatric:        Mood and Affect: Mood normal.        Behavior: Behavior normal.        Thought Content: Thought content normal.        Judgment: Judgment normal.  Vitals reviewed.    Assessment/Plan:  Encounter for annual routine gynecological examination  Encounter for screening mammogram for malignant neoplasm of breast - Plan: MM 3D SCREEN BREAST BILATERAL; pt to sched mammo. Change to bra without underwire to see if breast tenderness improves. If not, will eval further. Neg exam today.   Family history of ovarian cancer--MyRisk neg, no further screening indicated.          GYN counsel breast self exam, mammography screening, menopause, adequate intake of calcium and vitamin D, diet and exercise    F/U  Return in about 1 year (around 11/19/2021).  Corbitt Cloke B. Contrina Orona, PA-C 11/19/2020 8:29 AM

## 2020-11-19 ENCOUNTER — Ambulatory Visit (INDEPENDENT_AMBULATORY_CARE_PROVIDER_SITE_OTHER): Payer: BC Managed Care – PPO | Admitting: Obstetrics and Gynecology

## 2020-11-19 ENCOUNTER — Other Ambulatory Visit: Payer: Self-pay

## 2020-11-19 ENCOUNTER — Encounter: Payer: Self-pay | Admitting: Obstetrics and Gynecology

## 2020-11-19 VITALS — BP 120/72 | Ht 64.0 in | Wt 171.4 lb

## 2020-11-19 DIAGNOSIS — Z1231 Encounter for screening mammogram for malignant neoplasm of breast: Secondary | ICD-10-CM | POA: Diagnosis not present

## 2020-11-19 DIAGNOSIS — Z01419 Encounter for gynecological examination (general) (routine) without abnormal findings: Secondary | ICD-10-CM

## 2020-11-19 NOTE — Patient Instructions (Signed)
I value your feedback and you entrusting us with your care. If you get a [facility] patient survey, I would appreciate you taking the time to let us know about your experience today. Thank you!  [facility]: [phone]      

## 2020-12-13 ENCOUNTER — Ambulatory Visit
Admission: RE | Admit: 2020-12-13 | Discharge: 2020-12-13 | Disposition: A | Discharge status: Home / Self Care | Payer: BC Managed Care – PPO | Source: Ambulatory Visit | Attending: Obstetrics and Gynecology | Admitting: Obstetrics and Gynecology

## 2020-12-13 ENCOUNTER — Other Ambulatory Visit: Payer: Self-pay

## 2020-12-13 DIAGNOSIS — Z1231 Encounter for screening mammogram for malignant neoplasm of breast: Secondary | ICD-10-CM | POA: Diagnosis not present

## 2020-12-23 ENCOUNTER — Encounter: Payer: Self-pay | Admitting: Obstetrics and Gynecology

## 2021-03-25 DIAGNOSIS — E782 Mixed hyperlipidemia: Secondary | ICD-10-CM | POA: Diagnosis not present

## 2021-03-25 DIAGNOSIS — K760 Fatty (change of) liver, not elsewhere classified: Secondary | ICD-10-CM | POA: Diagnosis not present

## 2021-03-25 DIAGNOSIS — Z79899 Other long term (current) drug therapy: Secondary | ICD-10-CM | POA: Diagnosis not present

## 2021-03-25 DIAGNOSIS — Z0000 Encounter for general adult medical examination without abnormal findings: Secondary | ICD-10-CM | POA: Diagnosis not present

## 2021-03-25 DIAGNOSIS — Z131 Encounter for screening for diabetes mellitus: Secondary | ICD-10-CM | POA: Diagnosis not present

## 2021-03-25 DIAGNOSIS — E039 Hypothyroidism, unspecified: Secondary | ICD-10-CM | POA: Diagnosis not present

## 2021-06-20 DIAGNOSIS — E782 Mixed hyperlipidemia: Secondary | ICD-10-CM | POA: Diagnosis not present

## 2021-06-20 DIAGNOSIS — K760 Fatty (change of) liver, not elsewhere classified: Secondary | ICD-10-CM | POA: Diagnosis not present

## 2021-07-08 ENCOUNTER — Ambulatory Visit
Admission: EM | Admit: 2021-07-08 | Discharge: 2021-07-08 | Disposition: A | Discharge status: Home / Self Care | Payer: BC Managed Care – PPO | Attending: Family Medicine | Admitting: Family Medicine

## 2021-07-08 DIAGNOSIS — J0100 Acute maxillary sinusitis, unspecified: Secondary | ICD-10-CM

## 2021-07-08 MED ORDER — AZITHROMYCIN 250 MG PO TABS: ORAL_TABLET | ORAL | 0 refills | Status: DC

## 2021-07-08 NOTE — ED Triage Notes (Signed)
Pt states that last week she started with allergy problems and now it is going into her sinuses ? ?Pt states she coughed up green mucus, headache, runny nose with green mucus ? ?Pt states her eyes are hurting and vision blurry ? ?Pt states she tried Zyrtec ad Sudafed without much relief ? ?Denies Fever ?

## 2021-07-11 ENCOUNTER — Ambulatory Visit
Admission: RE | Admit: 2021-07-11 | Discharge: 2021-07-11 | Disposition: A | Discharge status: Home / Self Care | Payer: BC Managed Care – PPO | Source: Ambulatory Visit | Attending: Family Medicine | Admitting: Family Medicine

## 2021-07-11 VITALS — BP 106/67 | HR 90 | Temp 98.9°F | Resp 18

## 2021-07-11 DIAGNOSIS — J0100 Acute maxillary sinusitis, unspecified: Secondary | ICD-10-CM | POA: Diagnosis not present

## 2021-07-11 MED ORDER — PREDNISONE 20 MG PO TABS: 40.0000 mg | ORAL_TABLET | Freq: Every day | ORAL | 0 refills | Status: DC

## 2021-07-11 MED ORDER — FLUTICASONE PROPIONATE 50 MCG/ACT NA SUSP: 1.0000 | Freq: Two times a day (BID) | NASAL | 2 refills | Status: DC

## 2021-07-11 MED ORDER — DOXYCYCLINE HYCLATE 100 MG PO CAPS: 100.0000 mg | ORAL_CAPSULE | Freq: Two times a day (BID) | ORAL | 0 refills | Status: DC

## 2021-07-11 NOTE — ED Triage Notes (Signed)
Pt states she came in for sinus infection 3 days ago but it has gotten worse with the pressure and headache ? ?Pt states she tried sudafed, allergy meds, migraine meds and the antibiotic she was given 3 days ago  ?

## 2021-07-11 NOTE — ED Provider Notes (Signed)
?[place] ? ? ? ?CSN: [id number] ?Arrival date & time: 07/11/21  1349 ? ? ?  ? ?History   ?Chief Complaint ?Chief Complaint  ?Patient presents with  ? Headache  ?  Sinusitis - Entered by patient  ? ? ?HPI ?[name] [name] is a 61 y.o. female.  ? ?Presenting today following up on visit 3 days ago for sinusitis.  Started on allergy regimen, azithromycin and states her symptoms continue to progress and the pain in her sinuses is now severe.  Denies fever, chills, dizziness, chest pain, shortness of breath.  No new sick contacts recently. ? ? ?Past Medical History:  ?Diagnosis Date  ? BRCA negative 07/2019  ? MyRisk neg; IBIS=5.6%  ? Family history of ovarian cancer   ? Hx of blood clots   ? superficial veins (phlebitis) and currently under control   ? Hypercholesterolemia   ? Hyperlipidemia   ? Irritable bowel syndrome (IBS)   ? Lactose intolerance   ? Migraine   ? Phlebitis and thrombophlebitis of lower extremities, unspecified   ? Restless leg syndrome   ? bilateral but L>R. Currently not taking medication 2/2 upsetting stomach  ? Sleep apnea   ? sleep study/ CPAP fitting pending in 01/2016  ? ? ?Patient Active Problem List  ? Diagnosis Date Noted  ? Hyperlipidemia 12/09/2016  ? Hypothyroidism 12/09/2016  ? IBS (irritable bowel syndrome) 12/09/2016  ? Lactose intolerance 12/09/2016  ? Migraine headache 12/09/2016  ? Sleep apnea 12/09/2016  ? RLS (restless legs syndrome) 12/09/2016  ? Shoulder capsulitis 12/09/2016  ? H/O adenomatous polyp of colon 06/08/2016  ? ? ?Past Surgical History:  ?Procedure Laterality Date  ? ABDOMINAL HYSTERECTOMY    ? APPENDECTOMY    ? CHOLECYSTECTOMY    ? TUBAL LIGATION    ? ? ?OB History   ? ? Gravida  ?2  ? Para  ?2  ? Term  ?2  ? Preterm  ?   ? AB  ?   ? Living  ?2  ?  ? ? SAB  ?   ? IAB  ?   ? Ectopic  ?   ? Multiple  ?   ? Live Births  ?2  ?   ?  ?  ? ? ? ?Home Medications   ? ?Prior to Admission medications   ?Medication Sig Start Date End Date Taking? Authorizing Provider   ?doxycycline (VIBRAMYCIN) 100 MG capsule Take 1 capsule (100 mg total) by mouth 2 (two) times daily. 07/11/21  Yes Volney American, PA-C  ?fluticasone (FLONASE) 50 MCG/ACT nasal spray Place 1 spray into both nostrils 2 (two) times daily. 07/11/21  Yes Volney American, PA-C  ?predniSONE (DELTASONE) 20 MG tablet Take 2 tablets (40 mg total) by mouth daily with breakfast. 07/11/21  Yes Volney American, PA-C  ?Multiple Vitamin (MULTI-VITAMIN DAILY PO) Take by mouth.    [provider]  ?rosuvastatin (CRESTOR) 10 MG tablet Take 1 tablet by mouth daily. 02/17/20 11/11/21  [provider]  ? ? ?Family History ?Family History  ?Problem Relation Age of Onset  ? Hypertension Mother   ? Diabetes Mother   ?     type 2  ? Stroke Mother   ? Heart disease Mother   ? Hypertension Father   ? COPD Father   ? Heart disease Father   ? Emphysema Father   ? Arthritis Sister   ? Heart disease Sister   ? Hypercholesterolemia Sister   ?  x2  ? Emphysema Sister   ? Colitis Sister   ? Ovarian cancer Paternal Aunt   ?     not sure of age  ? Uterine cancer Paternal Aunt   ?     not sure of age  ? Colon cancer Paternal Aunt   ?     not sure  ? Brain cancer Paternal Aunt   ? Diabetes Maternal Grandmother   ?     type 2  ? Heart disease Maternal Grandmother   ? Skin cancer Maternal Grandmother   ? Cancer Other   ?     testicular  ? Lymphoma Other   ? Breast cancer Neg Hx   ? ? ?Social History ?Social History  ? ?Tobacco Use  ? Smoking status: Never  ?  Passive exposure: Never  ? Smokeless tobacco: Never  ?Vaping Use  ? Vaping Use: Never used  ?Substance Use Topics  ? Alcohol use: No  ? Drug use: No  ? ? ? ?Allergies   ?Lactose intolerance (gi) and Penicillins ? ? ?Review of Systems ?Review of Systems ?Per HPI ? ?Physical Exam ?Triage Vital Signs ?ED Triage Vitals  ?Enc Vitals Group  ?   BP 07/11/21 1407 106/67  ?   Pulse Rate 07/11/21 1407 90  ?   Resp 07/11/21 1407 18  ?   Temp 07/11/21 1407 98.9 ?F (37.2  ?C)  ?   Temp Source 07/11/21 1407 Oral  ?   SpO2 07/11/21 1407 95 %  ?   Weight --   ?   Height --   ?   Head Circumference --   ?   Peak Flow --   ?   Pain Score 07/11/21 1410 10  ?   Pain Loc --   ?   Pain Edu? --   ?   Excl. in [place]? --   ? ?No data found. ? ?Updated Vital Signs ?BP 106/67 (BP Location: Right Arm)   Pulse 90   Temp 98.9 ?F (37.2 ?C) (Oral)   Resp 18   SpO2 95%  ? ?Visual Acuity ?Right Eye Distance:   ?Left Eye Distance:   ?Bilateral Distance:   ? ?Right Eye Near:   ?Left Eye Near:    ?Bilateral Near:    ? ?Physical Exam ?Vitals and nursing note reviewed.  ?Constitutional:   ?   Appearance: Normal appearance.  ?HENT:  ?   Head: Atraumatic.  ?   Right Ear: Tympanic membrane and external ear normal.  ?   Left Ear: Tympanic membrane and external ear normal.  ?   Nose: Congestion present.  ?   Mouth/Throat:  ?   Mouth: Mucous membranes are moist.  ?   Pharynx: Posterior oropharyngeal erythema present.  ?Eyes:  ?   Extraocular Movements: Extraocular movements intact.  ?   Conjunctiva/sclera: Conjunctivae normal.  ?Cardiovascular:  ?   Rate and Rhythm: Normal rate and regular rhythm.  ?   Heart sounds: Normal heart sounds.  ?Pulmonary:  ?   Effort: Pulmonary effort is normal.  ?   Breath sounds: Normal breath sounds. No wheezing or rales.  ?Musculoskeletal:     ?   General: Normal range of motion.  ?   Cervical back: Normal range of motion and neck supple.  ?Skin: ?   General: Skin is warm and dry.  ?Neurological:  ?   Mental Status: She is alert and oriented to person, place, and time.  ?   Motor: No  weakness.  ?   Gait: Gait normal.  ?Psychiatric:     ?   Mood and Affect: Mood normal.     ?   Thought Content: Thought content normal.  ? ?UC Treatments / Results  ?Labs ?(all labs ordered are listed, but only abnormal results are displayed) ?Labs Reviewed - No data to display ? ?EKG ? ? ?Radiology ?No results found. ? ?Procedures ?Procedures (including critical care time) ? ?Medications Ordered in  UC ?Medications - No data to display ? ?Initial Impression / Assessment and Plan / UC Course  ?I have reviewed the triage vital signs and the nursing notes. ? ?Pertinent labs & imaging results that were available during my care of the patient were reviewed by me and considered in my medical decision making (see chart for details). ? ?  ? ?We will change antibiotic to doxycycline and add prednisone, Flonase.  Discussed supportive over-the-counter medications and home care additionally.  Return for acutely worsening symptoms. ? ?Final Clinical Impressions(s) / UC Diagnoses  ? ?Final diagnoses:  ?Acute maxillary sinusitis, recurrence not specified  ? ?Discharge Instructions   ?None ?  ? ?ED Prescriptions   ? ? Medication Sig Dispense Auth. Provider  ? doxycycline (VIBRAMYCIN) 100 MG capsule Take 1 capsule (100 mg total) by mouth 2 (two) times daily. 14 capsule Volney American, [place]  ? fluticasone [facility]) 50 MCG/ACT nasal spray Place 1 spray into both nostrils 2 (two) times daily. [place], [place]  ? predniSONE (DELTASONE) 20 MG tablet Take 2 tablets (40 mg total) by mouth daily with breakfast. 10 tablet Volney American, [place]  ? ?  ? ?PDMP not reviewed this encounter. ?  ?Volney American, PA-C ?07/11/21 1439 ? ?

## 2021-07-12 NOTE — ED Provider Notes (Signed)
?[place] ? ? ? ?CSN: [id number] ?Arrival date & time: 07/08/21  4580 ? ? ?  ? ?History   ?Chief Complaint ?Chief Complaint  ?Patient presents with  ? sinus issues  ? ? ?HPI ?[name] [name] is a 61 y.o. female.  ? ?Presenting today with over a week of nasal congestion, significant facial pain and pressure, thick green mucus, headache, eye pressure, cough.  Denies fever, chills, body aches, chest pain, shortness of breath.  Trying Sudafed and Zyrtec without any relief.  History of seasonal allergies.  ? ? ?Past Medical History:  ?Diagnosis Date  ? BRCA negative 07/2019  ? MyRisk neg; IBIS=5.6%  ? Family history of ovarian cancer   ? Hx of blood clots   ? superficial veins (phlebitis) and currently under control   ? Hypercholesterolemia   ? Hyperlipidemia   ? Irritable bowel syndrome (IBS)   ? Lactose intolerance   ? Migraine   ? Phlebitis and thrombophlebitis of lower extremities, unspecified   ? Restless leg syndrome   ? bilateral but L>R. Currently not taking medication 2/2 upsetting stomach  ? Sleep apnea   ? sleep study/ CPAP fitting pending in 01/2016  ? ? ?Patient Active Problem List  ? Diagnosis Date Noted  ? Hyperlipidemia 12/09/2016  ? Hypothyroidism 12/09/2016  ? IBS (irritable bowel syndrome) 12/09/2016  ? Lactose intolerance 12/09/2016  ? Migraine headache 12/09/2016  ? Sleep apnea 12/09/2016  ? RLS (restless legs syndrome) 12/09/2016  ? Shoulder capsulitis 12/09/2016  ? H/O adenomatous polyp of colon 06/08/2016  ? ? ?Past Surgical History:  ?Procedure Laterality Date  ? ABDOMINAL HYSTERECTOMY    ? APPENDECTOMY    ? CHOLECYSTECTOMY    ? TUBAL LIGATION    ? ? ?OB History   ? ? Gravida  ?2  ? Para  ?2  ? Term  ?2  ? Preterm  ?   ? AB  ?   ? Living  ?2  ?  ? ? SAB  ?   ? IAB  ?   ? Ectopic  ?   ? Multiple  ?   ? Live Births  ?2  ?   ?  ?  ? ? ? ?Home Medications   ? ?Prior to Admission medications   ?Medication Sig Start Date End Date Taking? Authorizing Provider  ?doxycycline (VIBRAMYCIN) 100 MG  capsule Take 1 capsule (100 mg total) by mouth 2 (two) times daily. 07/11/21   Volney American, PA-C  ?fluticasone (FLONASE) 50 MCG/ACT nasal spray Place 1 spray into both nostrils 2 (two) times daily. 07/11/21   Volney American, PA-C  ?Multiple Vitamin (MULTI-VITAMIN DAILY PO) Take by mouth.    [provider]  ?predniSONE (DELTASONE) 20 MG tablet Take 2 tablets (40 mg total) by mouth daily with breakfast. 07/11/21   Volney American, PA-C  ?rosuvastatin (CRESTOR) 10 MG tablet Take 1 tablet by mouth daily. 02/17/20 11/11/21  [provider]  ? ? ?Family History ?Family History  ?Problem Relation Age of Onset  ? Hypertension Mother   ? Diabetes Mother   ?     type 2  ? Stroke Mother   ? Heart disease Mother   ? Hypertension Father   ? COPD Father   ? Heart disease Father   ? Emphysema Father   ? Arthritis Sister   ? Heart disease Sister   ? Hypercholesterolemia Sister   ?     x2  ? Emphysema Sister   ?  Colitis Sister   ? Ovarian cancer Paternal Aunt   ?     not sure of age  ? Uterine cancer Paternal Aunt   ?     not sure of age  ? Colon cancer Paternal Aunt   ?     not sure  ? Brain cancer Paternal Aunt   ? Diabetes Maternal Grandmother   ?     type 2  ? Heart disease Maternal Grandmother   ? Skin cancer Maternal Grandmother   ? Cancer Other   ?     testicular  ? Lymphoma Other   ? Breast cancer Neg Hx   ? ? ?Social History ?Social History  ? ?Tobacco Use  ? Smoking status: Never  ?  Passive exposure: Never  ? Smokeless tobacco: Never  ?Vaping Use  ? Vaping Use: Never used  ?Substance Use Topics  ? Alcohol use: No  ? Drug use: No  ? ? ? ?Allergies   ?Lactose intolerance (gi) and Penicillins ? ? ?Review of Systems ?Review of Systems ?Per HPI ? ?Physical Exam ?Triage Vital Signs ?ED Triage Vitals  ?Enc Vitals Group  ?   BP 07/08/21 1133 113/75  ?   Pulse Rate 07/08/21 1133 (!) 54  ?   Resp 07/08/21 1133 18  ?   Temp 07/08/21 1133 98.3 ?F (36.8 ?C)  ?   Temp Source 07/08/21 1133 Oral   ?   SpO2 07/08/21 1133 97 %  ?   Weight --   ?   Height --   ?   Head Circumference --   ?   Peak Flow --   ?   Pain Score 07/08/21 1130 8  ?   Pain Loc --   ?   Pain Edu? --   ?   Excl. in [place]? --   ? ?No data found. ? ?Updated Vital Signs ?BP 113/75 (BP Location: Right Arm)   Pulse (!) 54   Temp 98.3 ?F (36.8 ?C) (Oral)   Resp 18   SpO2 97%  ? ?Visual Acuity ?Right Eye Distance:   ?Left Eye Distance:   ?Bilateral Distance:   ? ?Right Eye Near:   ?Left Eye Near:    ?Bilateral Near:    ? ?Physical Exam ?Vitals and nursing note reviewed.  ?Constitutional:   ?   Appearance: Normal appearance.  ?HENT:  ?   Head: Atraumatic.  ?   Right Ear: Tympanic membrane and external ear normal.  ?   Left Ear: Tympanic membrane and external ear normal.  ?   Nose: Congestion present.  ?   Mouth/Throat:  ?   Mouth: Mucous membranes are moist.  ?   Pharynx: Posterior oropharyngeal erythema present.  ?Eyes:  ?   Extraocular Movements: Extraocular movements intact.  ?   Conjunctiva/sclera: Conjunctivae normal.  ?Cardiovascular:  ?   Rate and Rhythm: Normal rate and regular rhythm.  ?   Heart sounds: Normal heart sounds.  ?Pulmonary:  ?   Effort: Pulmonary effort is normal.  ?   Breath sounds: Normal breath sounds. No wheezing or rales.  ?Musculoskeletal:     ?   General: Normal range of motion.  ?   Cervical back: Normal range of motion and neck supple.  ?Skin: ?   General: Skin is warm and dry.  ?Neurological:  ?   Mental Status: She is alert and oriented to person, place, and time.  ?Psychiatric:     ?   Mood and Affect:  Mood normal.     ?   Thought Content: Thought content normal.  ? ? ? ?UC Treatments / Results  ?Labs ?(all labs ordered are listed, but only abnormal results are displayed) ?Labs Reviewed - No data to display ? ?EKG ? ? ?Radiology ?No results found. ? ?Procedures ?Procedures (including critical care time) ? ?Medications Ordered in UC ?Medications - No data to display ? ?Initial Impression / Assessment and Plan / UC  Course  ?I have reviewed the triage vital signs and the nursing notes. ? ?Pertinent labs & imaging results that were available during my care of the patient were reviewed by me and considered in my medical decision making (see chart for details). ? ?  ? ?Treat for sinusitis with azithromycin, continued allergy regimen, nasal sprays, sinus rinses.  Return for worsening symptoms. ? ?Final Clinical Impressions(s) / UC Diagnoses  ? ?Final diagnoses:  ?Acute maxillary sinusitis, recurrence not specified  ? ?Discharge Instructions   ?None ?  ? ?ED Prescriptions   ? ? Medication Sig Dispense Auth. Provider  ? azithromycin (ZITHROMAX) 250 MG tablet Take first 2 tablets together, then 1 every day until finished. 6 tablet Volney American, [place]  ? ?  ? ?PDMP not reviewed this encounter. ?  ?Volney American, PA-C ?07/12/21 0110 ? ?

## 2021-08-29 DIAGNOSIS — Z86010 Personal history of colonic polyps: Secondary | ICD-10-CM | POA: Diagnosis not present

## 2021-08-29 DIAGNOSIS — Z1211 Encounter for screening for malignant neoplasm of colon: Secondary | ICD-10-CM | POA: Diagnosis not present

## 2021-09-10 ENCOUNTER — Ambulatory Visit: Payer: BC Managed Care – PPO | Admitting: Dermatology

## 2021-09-10 DIAGNOSIS — L820 Inflamed seborrheic keratosis: Secondary | ICD-10-CM | POA: Diagnosis not present

## 2021-09-10 DIAGNOSIS — C4491 Basal cell carcinoma of skin, unspecified: Secondary | ICD-10-CM

## 2021-09-10 DIAGNOSIS — C4441 Basal cell carcinoma of skin of scalp and neck: Secondary | ICD-10-CM | POA: Diagnosis not present

## 2021-09-10 DIAGNOSIS — L570 Actinic keratosis: Secondary | ICD-10-CM | POA: Diagnosis not present

## 2021-09-10 DIAGNOSIS — D492 Neoplasm of unspecified behavior of bone, soft tissue, and skin: Secondary | ICD-10-CM

## 2021-09-10 DIAGNOSIS — L814 Other melanin hyperpigmentation: Secondary | ICD-10-CM

## 2021-09-10 DIAGNOSIS — Z1283 Encounter for screening for malignant neoplasm of skin: Secondary | ICD-10-CM

## 2021-09-10 DIAGNOSIS — D229 Melanocytic nevi, unspecified: Secondary | ICD-10-CM | POA: Diagnosis not present

## 2021-09-10 DIAGNOSIS — D1800 Hemangioma unspecified site: Secondary | ICD-10-CM | POA: Diagnosis not present

## 2021-09-10 DIAGNOSIS — L821 Other seborrheic keratosis: Secondary | ICD-10-CM

## 2021-09-10 DIAGNOSIS — D225 Melanocytic nevi of trunk: Secondary | ICD-10-CM

## 2021-09-10 DIAGNOSIS — L578 Other skin changes due to chronic exposure to nonionizing radiation: Secondary | ICD-10-CM

## 2021-09-10 DIAGNOSIS — D1801 Hemangioma of skin and subcutaneous tissue: Secondary | ICD-10-CM

## 2021-09-10 HISTORY — DX: Basal cell carcinoma of skin, unspecified: C44.91

## 2021-09-10 NOTE — Progress Notes (Signed)
New Patient Visit  Subjective  [name] [name] is a 61 y.o. female who presents for the following: Annual Exam. The patient presents for Total-Body Skin Exam (TBSE) for skin cancer screening and mole check.  The patient has spots, moles and lesions to be evaluated, some may be new or changing and the patient has concerns that these could be cancer.   The following portions of the chart were reviewed this encounter and updated as appropriate:   Tobacco  Allergies  Meds  Problems  Med Hx  Surg Hx  Fam Hx     Review of Systems:  No other skin or systemic complaints except as noted in HPI or Assessment and Plan.  Objective  Well appearing patient in no apparent distress; mood and affect are within normal limits.  A full examination was performed including scalp, head, eyes, ears, nose, lips, neck, chest, axillae, abdomen, back, buttocks, bilateral upper extremities, bilateral lower extremities, hands, feet, fingers, toes, fingernails, and toenails. All findings within normal limits unless otherwise noted below.  right neck infra auricular 1.2 cm crusted papule        superior forehead x 3, right shouler x 2, left forearm x 1  (6) (6) Stuck-on, waxy, tan-brown papule or plaque --Discussed benign etiology and prognosis.   inferior umbilical 0.2 cm brown macule   right forehead x 1 Erythematous thin papules/macules with gritty scale.    Assessment & Plan  Neoplasm of skin right neck infra auricular Epidermal / dermal shaving  Lesion diameter (cm):  1.2 Informed consent: discussed and consent obtained   Patient was prepped and draped in usual sterile fashion: area prepped with alcohol. Anesthesia: the lesion was anesthetized in a standard fashion   Anesthetic:  1% lidocaine w/ epinephrine 1-100,000 buffered w/ 8.4% NaHCO3 Instrument used: flexible razor blade   Hemostasis achieved with: pressure, aluminum chloride and electrodesiccation   Outcome: patient tolerated  procedure well   Post-procedure details: wound care instructions given   Post-procedure details comment:  Ointment and small bandage applied  Destruction of lesion  Destruction method: electrodesiccation and curettage   Informed consent: discussed and consent obtained   Timeout:  patient name, date of birth, surgical site, and procedure verified Anesthesia: the lesion was anesthetized in a standard fashion   Anesthetic:  1% lidocaine w/ epinephrine 1-100,000 buffered w/ 8.4% NaHCO3 Curettage performed in three different directions: Yes   Electrodesiccation performed over the curetted area: Yes   Curettage cycles:  3 Lesion length (cm):  1.2 Lesion width (cm):  1.2 Margin per side (cm):  0.2 Final wound size (cm):  1.6 Hemostasis achieved with:  electrodesiccation Outcome: patient tolerated procedure well with no complications   Post-procedure details: sterile dressing applied and wound care instructions given   Dressing type: petrolatum    Specimen 1 - Surgical pathology Differential Diagnosis: R/O BCC  Check Margins: No Related Procedures Anatomic Pathology Report  Inflamed seborrheic keratosis (6) superior forehead x 3, right shouler x 2, left forearm x 1  (6) Symptomatic, irritating, patient would like treated.  Destruction of lesion - superior forehead x 3, right shouler x 2, left forearm x 1  (6) Complexity: simple   Destruction method: cryotherapy   Informed consent: discussed and consent obtained   Timeout:  patient name, date of birth, surgical site, and procedure verified Lesion destroyed using liquid nitrogen: Yes   Region frozen until ice ball extended beyond lesion: Yes   Outcome: patient tolerated procedure well with no complications  Post-procedure details: wound care instructions given    Nevus inferior umbilical Benign-appearing.  Observation.  Call clinic for new or changing moles.  Recommend daily use of broad spectrum spf 30+ sunscreen to sun-exposed  areas.    AK (actinic keratosis) right forehead x 1 Actinic keratoses are precancerous spots that appear secondary to cumulative UV radiation exposure/sun exposure over time. They are chronic with expected duration over 1 year. A portion of actinic keratoses will progress to squamous cell carcinoma of the skin. It is not possible to reliably predict which spots will progress to skin cancer and so treatment is recommended to prevent development of skin cancer.  Recommend daily broad spectrum sunscreen SPF 30+ to sun-exposed areas, reapply every 2 hours as needed.  Recommend staying in the shade or wearing long sleeves, sun glasses (UVA+UVB protection) and wide brim hats (4-inch brim around the entire circumference of the hat). Call for new or changing lesions.   Destruction of lesion - right forehead x 1  Destruction method: cryotherapy   Informed consent: discussed and consent obtained   Lesion destroyed using liquid nitrogen: Yes   Region frozen until ice ball extended beyond lesion: Yes   Outcome: patient tolerated procedure well with no complications   Post-procedure details: wound care instructions given    Skin cancer screening  Lentigines - Scattered tan macules - Due to sun exposure - Benign-appearing, observe - Recommend daily broad spectrum sunscreen SPF 30+ to sun-exposed areas, reapply every 2 hours as needed. - Call for any changes  Seborrheic Keratoses - Stuck-on, waxy, tan-brown papules and/or plaques  - Benign-appearing - Discussed benign etiology and prognosis. - Observe - Call for any changes  Melanocytic Nevi - Tan-brown and/or pink-flesh-colored symmetric macules and papules - Benign appearing on exam today - Observation - Call clinic for new or changing moles - Recommend daily use of broad spectrum spf 30+ sunscreen to sun-exposed areas.   Hemangiomas - Red papules - Discussed benign nature - Observe - Call for any changes  Actinic Damage - Chronic  condition, secondary to cumulative UV/sun exposure - diffuse scaly erythematous macules with underlying dyspigmentation - Recommend daily broad spectrum sunscreen SPF 30+ to sun-exposed areas, reapply every 2 hours as needed.  - Staying in the shade or wearing long sleeves, sun glasses (UVA+UVB protection) and wide brim hats (4-inch brim around the entire circumference of the hat) are also recommended for sun protection.  - Call for new or changing lesions.  Skin cancer screening performed today.   Follow up 1 year.  IMarye Round, CMA, am acting as scribe for Sarina Ser, MD .  Documentation: I have reviewed the above documentation for accuracy and completeness, and I agree with the above.  Sarina Ser, MD

## 2021-09-10 NOTE — Patient Instructions (Addendum)
Cryotherapy Aftercare  Wash gently with soap and water everyday.   Apply Vaseline and Band-Aid daily until healed.    Wound Care Instructions  Cleanse wound gently with soap and water once a day then pat dry with clean gauze. Apply a thing coat of Petrolatum (petroleum jelly, "Vaseline") over the wound (unless you have an allergy to this). We recommend that you use a new, sterile tube of Vaseline. Do not pick or remove scabs. Do not remove the yellow or white "healing tissue" from the base of the wound.  Cover the wound with fresh, clean, nonstick gauze and secure with paper tape. You may use Band-Aids in place of gauze and tape if the would is small enough, but would recommend trimming much of the tape off as there is often too much. Sometimes Band-Aids can irritate the skin.  You should call the office for your biopsy report after 1 week if you have not already been contacted.  If you experience any problems, such as abnormal amounts of bleeding, swelling, significant bruising, significant pain, or evidence of infection, please call the office immediately.  FOR ADULT SURGERY PATIENTS: If you need something for pain relief you may take 1 extra strength Tylenol (acetaminophen) AND 2 Ibuprofen (200mg each) together every 4 hours as needed for pain. (do not take these if you are allergic to them or if you have a reason you should not take them.) Typically, you may only need pain medication for 1 to 3 days.       Due to recent changes in healthcare laws, you may see results of your pathology and/or laboratory studies on MyChart before the doctors have had a chance to review them. We understand that in some cases there may be results that are confusing or concerning to you. Please understand that not all results are received at the same time and often the doctors may need to interpret multiple results in order to provide you with the best plan of care or course of treatment. Therefore, we ask  that you please give us 2 business days to thoroughly review all your results before contacting the office for clarification. Should we see a critical lab result, you will be contacted sooner.   If You Need Anything After Your Visit  If you have any questions or concerns for your doctor, please call our main line at [phone] and press option 4 to reach your doctor's medical assistant. If no one answers, please leave a voicemail as directed and we will return your call as soon as possible. Messages left after 4 pm will be answered the following business day.   You may also send us a message via MyChart. We typically respond to MyChart messages within 1-2 business days.  For prescription refills, please ask your pharmacy to contact our office. Our fax number is [phone].  If you have an urgent issue when the clinic is closed that cannot wait until the next business day, you can page your doctor at the number below.    Please note that while we do our best to be available for urgent issues outside of office hours, we are not available 24/7.   If you have an urgent issue and are unable to reach us, you may choose to seek medical care at your doctor's office, retail clinic, urgent care center, or emergency room.  If you have a medical emergency, please immediately call 911 or go to the emergency department.  Pager Numbers  - Dr. Kowalski:   [phone]  - Dr. Moye: [phone]  - Dr. Stewart: [phone]  In the event of inclement weather, please call our main line at [phone] for an update on the status of any delays or closures.  Dermatology Medication Tips: Please keep the boxes that topical medications come in in order to help keep track of the instructions about where and how to use these. Pharmacies typically print the medication instructions only on the boxes and not directly on the medication tubes.   If your medication is too expensive, please contact our office at  [phone] option 4 or send us a message through MyChart.   We are unable to tell what your co-pay for medications will be in advance as this is different depending on your insurance coverage. However, we may be able to find a substitute medication at lower cost or fill out paperwork to get insurance to cover a needed medication.   If a prior authorization is required to get your medication covered by your insurance company, please allow us 1-2 business days to complete this process.  Drug prices often vary depending on where the prescription is filled and some pharmacies may offer cheaper prices.  The website www.goodrx.com contains coupons for medications through different pharmacies. The prices here do not account for what the cost may be with help from insurance (it may be cheaper with your insurance), but the website can give you the price if you did not use any insurance.  - You can print the associated coupon and take it with your prescription to the pharmacy.  - You may also stop by our office during regular business hours and pick up a GoodRx coupon card.  - If you need your prescription sent electronically to a different pharmacy, notify our office through [place] MyChart or by phone at [phone] option 4.     Si Usted Necesita Algo Despus de Su Visita  Tambin puede enviarnos un mensaje a travs de MyChart. Por lo general respondemos a los mensajes de MyChart en el transcurso de 1 a 2 das hbiles.  Para renovar recetas, por favor pida a su farmacia que se ponga en contacto con nuestra oficina. Nuestro nmero de fax es el [phone].  Si tiene un asunto urgente cuando la clnica est cerrada y que no puede esperar hasta el siguiente da hbil, puede llamar/localizar a su doctor(a) al nmero que aparece a continuacin.   Por favor, tenga en cuenta que aunque hacemos todo lo posible para estar disponibles para asuntos urgentes fuera del horario de oficina, no estamos  disponibles las 24 horas del da, los 7 das de la semana.   Si tiene un problema urgente y no puede comunicarse con nosotros, puede optar por buscar atencin mdica  en el consultorio de su doctor(a), en una clnica privada, en un centro de atencin urgente o en una sala de emergencias.  Si tiene una emergencia mdica, por favor llame inmediatamente al 911 o vaya a la sala de emergencias.  Nmeros de bper  - Dr. Kowalski: [phone]  - Dra. Moye: [phone]  - Dra. Stewart: [phone]  En caso de inclemencias del tiempo, por favor llame a nuestra lnea principal al [phone] para una actualizacin sobre el estado de cualquier retraso o cierre.  Consejos para la medicacin en dermatologa: Por favor, guarde las cajas en las que vienen los medicamentos de uso tpico para ayudarle a seguir las instrucciones sobre dnde y cmo usarlos. Las farmacias generalmente imprimen las instrucciones del medicamento slo en   las cajas y no directamente en los tubos del medicamento.   Si su medicamento es muy caro, por favor, pngase en contacto con nuestra oficina llamando al [phone] y presione la opcin 4 o envenos un mensaje a travs de MyChart.   No podemos decirle cul ser su copago por los medicamentos por adelantado ya que esto es diferente dependiendo de la cobertura de su seguro. Sin embargo, es posible que podamos encontrar un medicamento sustituto a menor costo o llenar un formulario para que el seguro cubra el medicamento que se considera necesario.   Si se requiere una autorizacin previa para que su compaa de seguros cubra su medicamento, por favor permtanos de 1 a 2 das hbiles para completar este proceso.  Los precios de los medicamentos varan con frecuencia dependiendo del lugar de dnde se surte la receta y alguna farmacias pueden ofrecer precios ms baratos.  El sitio web www.goodrx.com tiene cupones para medicamentos de diferentes farmacias. Los precios aqu no  tienen en cuenta lo que podra costar con la ayuda del seguro (puede ser ms barato con su seguro), pero el sitio web puede darle el precio si no utiliz ningn seguro.  - Puede imprimir el cupn correspondiente y llevarlo con su receta a la farmacia.  - Tambin puede pasar por nuestra oficina durante el horario de atencin regular y recoger una tarjeta de cupones de GoodRx.  - Si necesita que su receta se enve electrnicamente a una farmacia diferente, informe a nuestra oficina a travs de MyChart de [place] o por telfono llamando al [phone] y presione la opcin 4.  

## 2021-09-11 ENCOUNTER — Encounter: Payer: Self-pay | Admitting: Dermatology

## 2021-09-17 LAB — ANATOMIC PATHOLOGY REPORT

## 2021-09-17 LAB — SPECIMEN STATUS REPORT

## 2021-09-18 ENCOUNTER — Telehealth: Payer: Self-pay

## 2021-09-18 NOTE — Telephone Encounter (Signed)
Advised pt of bx results.  Pt is scheduled for 57mf/u so advised the doctor would recheck at f/u./sh

## 2021-09-18 NOTE — Telephone Encounter (Signed)
-----   Message from Alfonso Patten, MD sent at 09/18/2021 12:48 PM EDT ----- BCC --> already treated with [facility]  Recommend recheck in 6 months. Already scheduled for [facility]SE in 1 year.   MAs please call. Thank you!  CC'd Dr. Nehemiah Massed for review

## 2022-01-01 ENCOUNTER — Ambulatory Visit: Payer: BC Managed Care – PPO | Admitting: Dermatology

## 2022-01-01 DIAGNOSIS — D485 Neoplasm of uncertain behavior of skin: Secondary | ICD-10-CM

## 2022-01-01 DIAGNOSIS — L738 Other specified follicular disorders: Secondary | ICD-10-CM | POA: Diagnosis not present

## 2022-01-01 DIAGNOSIS — C44319 Basal cell carcinoma of skin of other parts of face: Secondary | ICD-10-CM | POA: Diagnosis not present

## 2022-01-01 DIAGNOSIS — L820 Inflamed seborrheic keratosis: Secondary | ICD-10-CM

## 2022-01-01 DIAGNOSIS — L821 Other seborrheic keratosis: Secondary | ICD-10-CM

## 2022-01-01 DIAGNOSIS — Z85828 Personal history of other malignant neoplasm of skin: Secondary | ICD-10-CM | POA: Diagnosis not present

## 2022-01-01 DIAGNOSIS — L578 Other skin changes due to chronic exposure to nonionizing radiation: Secondary | ICD-10-CM

## 2022-01-01 DIAGNOSIS — C44310 Basal cell carcinoma of skin of unspecified parts of face: Secondary | ICD-10-CM

## 2022-01-01 NOTE — Patient Instructions (Signed)
Wound Care Instructions  Cleanse wound gently with soap and water once a day then pat dry with clean gauze. Apply a thin coat of Petrolatum (petroleum jelly, "Vaseline") over the wound (unless you have an allergy to this). We recommend that you use a new, sterile tube of Vaseline. Do not pick or remove scabs. Do not remove the yellow or white "healing tissue" from the base of the wound.  Cover the wound with fresh, clean, nonstick gauze and secure with paper tape. You may use Band-Aids in place of gauze and tape if the wound is small enough, but would recommend trimming much of the tape off as there is often too much. Sometimes Band-Aids can irritate the skin.  You should call the office for your biopsy report after 1 week if you have not already been contacted.  If you experience any problems, such as abnormal amounts of bleeding, swelling, significant bruising, significant pain, or evidence of infection, please call the office immediately.  FOR ADULT SURGERY PATIENTS: If you need something for pain relief you may take 1 extra strength Tylenol (acetaminophen) AND 2 Ibuprofen (200mg each) together every 4 hours as needed for pain. (do not take these if you are allergic to them or if you have a reason you should not take them.) Typically, you may only need pain medication for 1 to 3 days.     Due to recent changes in healthcare laws, you may see results of your pathology and/or laboratory studies on MyChart before the doctors have had a chance to review them. We understand that in some cases there may be results that are confusing or concerning to you. Please understand that not all results are received at the same time and often the doctors may need to interpret multiple results in order to provide you with the best plan of care or course of treatment. Therefore, we ask that you please give us 2 business days to thoroughly review all your results before contacting the office for clarification. Should  we see a critical lab result, you will be contacted sooner.   If You Need Anything After Your Visit  If you have any questions or concerns for your doctor, please call our main line at [phone] and press option 4 to reach your doctor's medical assistant. If no one answers, please leave a voicemail as directed and we will return your call as soon as possible. Messages left after 4 pm will be answered the following business day.   You may also send us a message via MyChart. We typically respond to MyChart messages within 1-2 business days.  For prescription refills, please ask your pharmacy to contact our office. Our fax number is [phone].  If you have an urgent issue when the clinic is closed that cannot wait until the next business day, you can page your doctor at the number below.    Please note that while we do our best to be available for urgent issues outside of office hours, we are not available 24/7.   If you have an urgent issue and are unable to reach us, you may choose to seek medical care at your doctor's office, retail clinic, urgent care center, or emergency room.  If you have a medical emergency, please immediately call 911 or go to the emergency department.  Pager Numbers  - Dr. Kowalski: [phone]  - Dr. Moye: [phone]  - Dr. Stewart: [phone]  In the event of inclement weather, please call our main line at   [phone] for an update on the status of any delays or closures.  Dermatology Medication Tips: Please keep the boxes that topical medications come in in order to help keep track of the instructions about where and how to use these. Pharmacies typically print the medication instructions only on the boxes and not directly on the medication tubes.   If your medication is too expensive, please contact our office at [phone] option 4 or send us a message through MyChart.   We are unable to tell what your co-pay for medications will be in  advance as this is different depending on your insurance coverage. However, we may be able to find a substitute medication at lower cost or fill out paperwork to get insurance to cover a needed medication.   If a prior authorization is required to get your medication covered by your insurance company, please allow us 1-2 business days to complete this process.  Drug prices often vary depending on where the prescription is filled and some pharmacies may offer cheaper prices.  The website www.goodrx.com contains coupons for medications through different pharmacies. The prices here do not account for what the cost may be with help from insurance (it may be cheaper with your insurance), but the website can give you the price if you did not use any insurance.  - You can print the associated coupon and take it with your prescription to the pharmacy.  - You may also stop by our office during regular business hours and pick up a GoodRx coupon card.  - If you need your prescription sent electronically to a different pharmacy, notify our office through [place] MyChart or by phone at [phone] option 4.     Si Usted Necesita Algo Despus de Su Visita  Tambin puede enviarnos un mensaje a travs de MyChart. Por lo general respondemos a los mensajes de MyChart en el transcurso de 1 a 2 das hbiles.  Para renovar recetas, por favor pida a su farmacia que se ponga en contacto con nuestra oficina. Nuestro nmero de fax es el [phone].  Si tiene un asunto urgente cuando la clnica est cerrada y que no puede esperar hasta el siguiente da hbil, puede llamar/localizar a su doctor(a) al nmero que aparece a continuacin.   Por favor, tenga en cuenta que aunque hacemos todo lo posible para estar disponibles para asuntos urgentes fuera del horario de oficina, no estamos disponibles las 24 horas del da, los 7 das de la semana.   Si tiene un problema urgente y no puede comunicarse con nosotros, puede  optar por buscar atencin mdica  en el consultorio de su doctor(a), en una clnica privada, en un centro de atencin urgente o en una sala de emergencias.  Si tiene una emergencia mdica, por favor llame inmediatamente al 911 o vaya a la sala de emergencias.  Nmeros de bper  - Dr. Kowalski: [phone]  - Dra. Moye: [phone]  - Dra. Stewart: [phone]  En caso de inclemencias del tiempo, por favor llame a nuestra lnea principal al [phone] para una actualizacin sobre el estado de cualquier retraso o cierre.  Consejos para la medicacin en dermatologa: Por favor, guarde las cajas en las que vienen los medicamentos de uso tpico para ayudarle a seguir las instrucciones sobre dnde y cmo usarlos. Las farmacias generalmente imprimen las instrucciones del medicamento slo en las cajas y no directamente en los tubos del medicamento.   Si su medicamento es muy caro, por favor, pngase en contacto con   nuestra oficina llamando al [phone] y presione la opcin 4 o envenos un mensaje a travs de MyChart.   No podemos decirle cul ser su copago por los medicamentos por adelantado ya que esto es diferente dependiendo de la cobertura de su seguro. Sin embargo, es posible que podamos encontrar un medicamento sustituto a menor costo o llenar un formulario para que el seguro cubra el medicamento que se considera necesario.   Si se requiere una autorizacin previa para que su compaa de seguros cubra su medicamento, por favor permtanos de 1 a 2 das hbiles para completar este proceso.  Los precios de los medicamentos varan con frecuencia dependiendo del lugar de dnde se surte la receta y alguna farmacias pueden ofrecer precios ms baratos.  El sitio web www.goodrx.com tiene cupones para medicamentos de diferentes farmacias. Los precios aqu no tienen en cuenta lo que podra costar con la ayuda del seguro (puede ser ms barato con su seguro), pero el sitio web puede darle el  precio si no utiliz ningn seguro.  - Puede imprimir el cupn correspondiente y llevarlo con su receta a la farmacia.  - Tambin puede pasar por nuestra oficina durante el horario de atencin regular y recoger una tarjeta de cupones de GoodRx.  - Si necesita que su receta se enve electrnicamente a una farmacia diferente, informe a nuestra oficina a travs de MyChart de [place] o por telfono llamando al [phone] y presione la opcin 4.  

## 2022-01-01 NOTE — Progress Notes (Signed)
Follow-Up Visit   Subjective  [name] [name] is a 61 y.o. female who presents for the following: Irregular skin lesion (Previously treated with LN2 but patient states that lesion has grown larger since treatment. She is concerned about it and would like it checked today), Recheck BCC site (R neck infra auricular - previously treated with ED&C, patient is here today to check for recurrence), and check spots (L forearm, L shoulder, irritating, one on L forearm txted with LN2 last visit/Forehead, not sure how long it has been there). The patient has spots, moles and lesions to be evaluated, some may be new or changing and the patient has concerns that these could be cancer.  The following portions of the chart were reviewed this encounter and updated as appropriate:   Tobacco  Allergies  Meds  Problems  Med Hx  Surg Hx  Fam Hx     Review of Systems:  No other skin or systemic complaints except as noted in HPI or Assessment and Plan.  Objective  Well appearing patient in no apparent distress; mood and affect are within normal limits.  A focused examination was performed including the face, trunk, and extremities. Relevant physical exam findings are noted in the Assessment and Plan.  superior midline forehead 1.1cm pearly pap     superior midline forehead 0.4cm pink pap     R forehead x 1, L top of shoulder x 1, L forearm x 1 (3) Stuck on waxy paps with erythema  R neck infra auricular Well healed scar with no evidence of recurrence.    Assessment & Plan   Seborrheic Keratoses - Stuck-on, waxy, tan-brown papules and/or plaques  - Benign-appearing - Discussed benign etiology and prognosis. - Observe - Call for any changes  Actinic Damage - chronic, secondary to cumulative UV radiation exposure/sun exposure over time - diffuse scaly erythematous macules with underlying dyspigmentation - Recommend daily broad spectrum sunscreen SPF 30+ to sun-exposed areas, reapply  every 2 hours as needed.  - Recommend staying in the shade or wearing long sleeves, sun glasses (UVA+UVB protection) and wide brim hats (4-inch brim around the entire circumference of the hat). - Call for new or changing lesions.   Neoplasm of uncertain behavior of skin superior midline forehead Epidermal / dermal shaving  See photo  Lesion diameter (cm):  1.1 Informed consent: discussed and consent obtained   Timeout: patient name, date of birth, surgical site, and procedure verified   Procedure prep:  Patient was prepped and draped in usual sterile fashion Prep type:  Isopropyl alcohol Anesthesia: the lesion was anesthetized in a standard fashion   Anesthetic:  1% lidocaine w/ epinephrine 1-100,000 buffered w/ 8.4% NaHCO3 Instrument used: flexible razor blade   Hemostasis achieved with: pressure, aluminum chloride and electrodesiccation   Outcome: patient tolerated procedure well   Post-procedure details: sterile dressing applied and wound care instructions given   Dressing type: bandage and bacitracin    Destruction of lesion Complexity: extensive   Destruction method: electrodesiccation and curettage   Informed consent: discussed and consent obtained   Timeout:  patient name, date of birth, surgical site, and procedure verified Procedure prep:  Patient was prepped and draped in usual sterile fashion Prep type:  Isopropyl alcohol Anesthesia: the lesion was anesthetized in a standard fashion   Anesthetic:  1% lidocaine w/ epinephrine 1-100,000 buffered w/ 8.4% NaHCO3 Curettage performed in three different directions: Yes   Electrodesiccation performed over the curetted area: Yes   Lesion length (cm):  1.1 Lesion width (cm):  1.1 Margin per side (cm):  0.2 Final wound size (cm):  1.5 Hemostasis achieved with:  pressure, aluminum chloride and electrodesiccation Outcome: patient tolerated procedure well with no complications   Post-procedure details: sterile dressing applied  and wound care instructions given   Dressing type: bandage and bacitracin    Related Procedures Anatomic Pathology Report  Sebaceous hyperplasia superior midline forehead Vs other recheck on f/u  Inflamed seborrheic keratosis (3) R forehead x 1, L top of shoulder x 1, L forearm x 1 Symptomatic, irritating, patient would like treated. Destruction of lesion - R forehead x 1, L top of shoulder x 1, L forearm x 1 Complexity: simple   Destruction method: cryotherapy   Informed consent: discussed and consent obtained   Timeout:  patient name, date of birth, surgical site, and procedure verified Lesion destroyed using liquid nitrogen: Yes   Region frozen until ice ball extended beyond lesion: Yes   Outcome: patient tolerated procedure well with no complications   Post-procedure details: wound care instructions given    History of basal cell carcinoma (BCC) R neck infra auricular Clear. Observe for recurrence. Call clinic for new or changing lesions.  Recommend regular skin exams, daily broad-spectrum spf 30+ sunscreen use, and photoprotection.    Return in 4 months (on 05/04/2022) for recheck Sebaceous hyperplasia vs other.  Luther Redo, CMA, am acting as scribe for Sarina Ser, MD .  I, Othelia Pulling, RMA, am acting as scribe for Sarina Ser, MD . Documentation: I have reviewed the above documentation for accuracy and completeness, and I agree with the above.  Sarina Ser, MD

## 2022-01-13 ENCOUNTER — Encounter: Payer: Self-pay | Admitting: Dermatology

## 2022-01-21 ENCOUNTER — Telehealth: Payer: Self-pay

## 2022-01-21 LAB — ANATOMIC PATHOLOGY REPORT

## 2022-01-21 NOTE — Telephone Encounter (Signed)
From: Ralene Bathe, MD Sent: 01/20/2022   1:14 PM EDT To: Dicie Beam, CMA; Clare Gandy, CMA  Can you check on this pts path? It went to [place], but delayed because they did not pick up out of box on porch. I believe they got it last week, but could be still in box. It was from 10/12/ visit  -Called [facility] and BX was received and in the dermatology department processing. They stated they will have results this week. aw

## 2022-01-21 NOTE — Telephone Encounter (Signed)
Advised pt of bx result/sh ?

## 2022-01-21 NOTE — Telephone Encounter (Signed)
-----   Message from Ralene Bathe, MD sent at 01/21/2022 11:57 AM EDT ----- Diagnosis synopsis: See below:Abnormal  Comment: Specimen 1-Skin Biopsy, Superior Midline Forehead: BASAL  CELL CARCINOMA, NODULAR AND MICRONODULAR TYPE. THE DEEP  MARGIN IS INVOLVED  Cancer - [place] Already treated Recheck next visit in Feb 2024

## 2022-03-09 NOTE — Progress Notes (Unsigned)
PCP: Idelle Crouch, MD   No chief complaint on file.   HPI:      [name] is a 61 y.o. G2P2002 whose LMP was No LMP recorded. Patient has had a hysterectomy., presents today for her annual examination.  Her menses are absent due to TAH for leio/AUB/pelvic pain. She does not have PMB. She does not have vasomotor sx.   Sex activity: not sexually active. She does not have vaginal dryness.  Last Pap: 12/09/16  Results were: no abnormalities /neg HPV DNA.   Last mammogram: 12/13/20  Results were: normal--routine follow-up in 12 months There is no FH of breast cancer. There is a FH of ovarian cancer in her pat aunt. There is also a FH of uterine, colon and brain cancer in her pat aunts. Pt is MyRisk neg 5/21; IBIS=5.6%. The patient does self-breast exams. Has occas pain under RT breast along ribs lasting a few days, no masses. Sx intermittent. Has tried changing underwire bras but always wears underwire.  Colonoscopy: 2018; Repeat due after 5 years per pt. Followed by PCP.   Tobacco use: The patient denies current or previous tobacco use. Alcohol use: none  No drug use Exercise: moderately active  She does not get adequate calcium and Vitamin D in her diet.  Labs with PCP.   Past Medical History:  Diagnosis Date   Basal cell carcinoma 09/10/2021   right neck infra auricular, [facility]   Basal cell carcinoma 01/01/2022   Superior Midline Forehead, [facility]   BRCA negative 07/2019   MyRisk neg; IBIS=5.6%   Family history of ovarian cancer    Hx of blood clots    superficial veins (phlebitis) and currently under control    Hypercholesterolemia    Hyperlipidemia    Irritable bowel syndrome (IBS)    Lactose intolerance    Migraine    Phlebitis and thrombophlebitis of lower extremities, unspecified    Restless leg syndrome    bilateral but L>R. Currently not taking medication 2/2 upsetting stomach   Sleep apnea    sleep study/ CPAP fitting pending in 01/2016    Past  Surgical History:  Procedure Laterality Date   ABDOMINAL HYSTERECTOMY     APPENDECTOMY     CHOLECYSTECTOMY     TUBAL LIGATION      Family History  Problem Relation Age of Onset   Hypertension Mother    Diabetes Mother        type 2   Stroke Mother    Heart disease Mother    Hypertension Father    COPD Father    Heart disease Father    Emphysema Father    Arthritis Sister    Heart disease Sister    Hypercholesterolemia Sister        x2   Emphysema Sister    Colitis Sister    Ovarian cancer Paternal Aunt        not sure of age   Uterine cancer Paternal 52        not sure of age   Colon cancer Paternal 78        not sure   Brain cancer Paternal Aunt    Diabetes Maternal Grandmother        type 2   Heart disease Maternal Grandmother    Skin cancer Maternal Grandmother    Cancer Other        testicular   Lymphoma Other    Breast cancer Neg Hx     Social  History   Socioeconomic History   Marital status: Married    Spouse name: Not on file   Number of children: Not on file   Years of education: Not on file   Highest education level: Not on file  Occupational History   Not on file  Tobacco Use   Smoking status: Never    Passive exposure: Never   Smokeless tobacco: Never  Vaping Use   Vaping Use: Never used  Substance and Sexual Activity   Alcohol use: No   Drug use: No   Sexual activity: Not Currently    Birth control/protection: Surgical    Comment: Hysterectomy  Other Topics Concern   Not on file  Social History Narrative   Not on file   Social Determinants of Health   Financial Resource Strain: Not on file  Food Insecurity: Not on file  Transportation Needs: Not on file  Physical Activity: Not on file  Stress: Not on file  Social Connections: Not on file  Intimate Partner Violence: Not on file     Current Outpatient Medications:    doxycycline (VIBRAMYCIN) 100 MG capsule, Take 1 capsule (100 mg total) by mouth 2 (two) times daily.  (Patient not taking: Reported on 01/01/2022), Disp: 14 capsule, Rfl: 0   fluticasone (FLONASE) 50 MCG/ACT nasal spray, Place 1 spray into both nostrils 2 (two) times daily., Disp: 16 g, Rfl: 2   Multiple Vitamin (MULTI-VITAMIN DAILY PO), Take by mouth., Disp: , Rfl:    predniSONE (DELTASONE) 20 MG tablet, Take 2 tablets (40 mg total) by mouth daily with breakfast. (Patient not taking: Reported on 01/01/2022), Disp: 10 tablet, Rfl: 0   rosuvastatin (CRESTOR) 10 MG tablet, Take 1 tablet by mouth daily., Disp: , Rfl:      ROS:  Review of Systems  Constitutional:  Negative for fatigue, fever and unexpected weight change.  Respiratory:  Negative for cough, shortness of breath and wheezing.   Cardiovascular:  Negative for chest pain, palpitations and leg swelling.  Gastrointestinal:  Negative for blood in stool, constipation, diarrhea, nausea and vomiting.  Endocrine: Negative for cold intolerance, heat intolerance and polyuria.  Genitourinary:  Negative for dyspareunia, dysuria, flank pain, frequency, genital sores, hematuria, menstrual problem, pelvic pain, urgency, vaginal bleeding, vaginal discharge and vaginal pain.  Musculoskeletal:  Negative for back pain, joint swelling and myalgias.  Skin:  Negative for rash.  Neurological:  Negative for dizziness, syncope, light-headedness, numbness and headaches.  Hematological:  Negative for adenopathy.  Psychiatric/Behavioral:  Negative for agitation, confusion, sleep disturbance and suicidal ideas. The patient is not nervous/anxious.    BREAST: No symptoms    Objective: There were no vitals taken for this visit.   Physical Exam Constitutional:      Appearance: She is well-developed.  Genitourinary:     Vulva normal.     Genitourinary Comments: UTERUS/CX SURG REM     Right Labia: No rash, tenderness or lesions.    Left Labia: No tenderness, lesions or rash.    Vaginal cuff intact.    No vaginal discharge, erythema or tenderness.       Right Adnexa: not tender and no mass present.    Left Adnexa: not tender and no mass present.    Cervix is absent.     Uterus is absent.  Breasts:    Right: No mass, nipple discharge, skin change or tenderness.     Left: No mass, nipple discharge, skin change or tenderness.  Neck:     Thyroid: No  thyromegaly.  Cardiovascular:     Rate and Rhythm: Normal rate and regular rhythm.     Heart sounds: Normal heart sounds. No murmur heard. Pulmonary:     Effort: Pulmonary effort is normal.     Breath sounds: Normal breath sounds.  Chest:    Abdominal:     Palpations: Abdomen is soft.     Tenderness: There is no abdominal tenderness. There is no guarding.  Musculoskeletal:        General: Normal range of motion.     Cervical back: Normal range of motion.  Neurological:     General: No focal deficit present.     Mental Status: She is alert and oriented to person, place, and time.     Cranial Nerves: No cranial nerve deficit.  Skin:    General: Skin is warm and dry.  Psychiatric:        Mood and Affect: Mood normal.        Behavior: Behavior normal.        Thought Content: Thought content normal.        Judgment: Judgment normal.  Vitals reviewed.     Assessment/Plan:  Encounter for annual routine gynecological examination  Encounter for screening mammogram for malignant neoplasm of breast - Plan: MM 3D SCREEN BREAST BILATERAL; pt to sched mammo. Change to bra without underwire to see if breast tenderness improves. If not, will eval further. Neg exam today.   Family history of ovarian cancer--MyRisk neg, no further screening indicated.          GYN counsel breast self exam, mammography screening, menopause, adequate intake of calcium and vitamin D, diet and exercise    F/U  No follow-ups on file.  Naje Rice B. Pristine Gladhill, PA-C 03/09/2022 7:36 PM

## 2022-03-10 ENCOUNTER — Encounter: Payer: Self-pay | Admitting: Obstetrics and Gynecology

## 2022-03-10 ENCOUNTER — Ambulatory Visit (INDEPENDENT_AMBULATORY_CARE_PROVIDER_SITE_OTHER): Payer: BC Managed Care – PPO | Admitting: Obstetrics and Gynecology

## 2022-03-10 VITALS — BP 100/64 | Ht 64.0 in | Wt 180.0 lb

## 2022-03-10 DIAGNOSIS — Z1211 Encounter for screening for malignant neoplasm of colon: Secondary | ICD-10-CM

## 2022-03-10 DIAGNOSIS — Z23 Encounter for immunization: Secondary | ICD-10-CM

## 2022-03-10 DIAGNOSIS — Z124 Encounter for screening for malignant neoplasm of cervix: Secondary | ICD-10-CM

## 2022-03-10 DIAGNOSIS — Z1231 Encounter for screening mammogram for malignant neoplasm of breast: Secondary | ICD-10-CM

## 2022-03-10 DIAGNOSIS — Z1151 Encounter for screening for human papillomavirus (HPV): Secondary | ICD-10-CM | POA: Diagnosis not present

## 2022-03-10 DIAGNOSIS — Z01419 Encounter for gynecological examination (general) (routine) without abnormal findings: Secondary | ICD-10-CM

## 2022-03-10 NOTE — Patient Instructions (Signed)
I value your feedback and you entrusting us with your care. If you get a [facility] patient survey, I would appreciate you taking the time to let us know about your experience today. Thank you!  [facility]: [phone]      

## 2022-03-17 ENCOUNTER — Ambulatory Visit
Admission: RE | Admit: 2022-03-17 | Discharge: 2022-03-17 | Disposition: A | Discharge status: Home / Self Care | Payer: BC Managed Care – PPO | Source: Ambulatory Visit | Attending: Obstetrics and Gynecology | Admitting: Obstetrics and Gynecology

## 2022-03-17 DIAGNOSIS — Z1231 Encounter for screening mammogram for malignant neoplasm of breast: Secondary | ICD-10-CM | POA: Insufficient documentation

## 2022-03-18 LAB — IGP, APTIMA HPV: HPV Aptima: NEGATIVE

## 2022-05-04 ENCOUNTER — Ambulatory Visit: Payer: BC Managed Care – PPO | Admitting: Dermatology

## 2022-05-15 DIAGNOSIS — Z131 Encounter for screening for diabetes mellitus: Secondary | ICD-10-CM | POA: Diagnosis not present

## 2022-05-15 DIAGNOSIS — Z0000 Encounter for general adult medical examination without abnormal findings: Secondary | ICD-10-CM | POA: Diagnosis not present

## 2022-05-15 DIAGNOSIS — G2581 Restless legs syndrome: Secondary | ICD-10-CM | POA: Diagnosis not present

## 2022-05-15 DIAGNOSIS — R413 Other amnesia: Secondary | ICD-10-CM | POA: Diagnosis not present

## 2022-05-15 DIAGNOSIS — Z79899 Other long term (current) drug therapy: Secondary | ICD-10-CM | POA: Diagnosis not present

## 2022-05-15 DIAGNOSIS — K760 Fatty (change of) liver, not elsewhere classified: Secondary | ICD-10-CM | POA: Diagnosis not present

## 2022-05-15 DIAGNOSIS — E7800 Pure hypercholesterolemia, unspecified: Secondary | ICD-10-CM | POA: Diagnosis not present

## 2022-05-15 DIAGNOSIS — E669 Obesity, unspecified: Secondary | ICD-10-CM | POA: Diagnosis not present

## 2022-05-18 ENCOUNTER — Ambulatory Visit: Payer: BC Managed Care – PPO | Admitting: Dermatology

## 2022-05-18 ENCOUNTER — Encounter: Payer: Self-pay | Admitting: Dermatology

## 2022-05-18 VITALS — BP 110/60 | HR 64

## 2022-05-18 DIAGNOSIS — C44319 Basal cell carcinoma of skin of other parts of face: Secondary | ICD-10-CM | POA: Diagnosis not present

## 2022-05-18 DIAGNOSIS — Z85828 Personal history of other malignant neoplasm of skin: Secondary | ICD-10-CM

## 2022-05-18 DIAGNOSIS — L578 Other skin changes due to chronic exposure to nonionizing radiation: Secondary | ICD-10-CM | POA: Diagnosis not present

## 2022-05-18 DIAGNOSIS — L821 Other seborrheic keratosis: Secondary | ICD-10-CM | POA: Diagnosis not present

## 2022-05-18 DIAGNOSIS — C44310 Basal cell carcinoma of skin of unspecified parts of face: Secondary | ICD-10-CM

## 2022-05-18 DIAGNOSIS — D492 Neoplasm of unspecified behavior of bone, soft tissue, and skin: Secondary | ICD-10-CM | POA: Diagnosis not present

## 2022-05-18 NOTE — Patient Instructions (Signed)
Wound Care Instructions  Cleanse wound gently with soap and water once a day then pat dry with clean gauze. Apply a thin coat of Petrolatum (petroleum jelly, "Vaseline") over the wound (unless you have an allergy to this). We recommend that you use a new, sterile tube of Vaseline. Do not pick or remove scabs. Do not remove the yellow or white "healing tissue" from the base of the wound.  Cover the wound with fresh, clean, nonstick gauze and secure with paper tape. You may use Band-Aids in place of gauze and tape if the wound is small enough, but would recommend trimming much of the tape off as there is often too much. Sometimes Band-Aids can irritate the skin.  You should call the office for your biopsy report after 1 week if you have not already been contacted.  If you experience any problems, such as abnormal amounts of bleeding, swelling, significant bruising, significant pain, or evidence of infection, please call the office immediately.  FOR ADULT SURGERY PATIENTS: If you need something for pain relief you may take 1 extra strength Tylenol (acetaminophen) AND 2 Ibuprofen (200mg each) together every 4 hours as needed for pain. (do not take these if you are allergic to them or if you have a reason you should not take them.) Typically, you may only need pain medication for 1 to 3 days.     Due to recent changes in healthcare laws, you may see results of your pathology and/or laboratory studies on MyChart before the doctors have had a chance to review them. We understand that in some cases there may be results that are confusing or concerning to you. Please understand that not all results are received at the same time and often the doctors may need to interpret multiple results in order to provide you with the best plan of care or course of treatment. Therefore, we ask that you please give us 2 business days to thoroughly review all your results before contacting the office for clarification. Should  we see a critical lab result, you will be contacted sooner.   If You Need Anything After Your Visit  If you have any questions or concerns for your doctor, please call our main line at [phone] and press option 4 to reach your doctor's medical assistant. If no one answers, please leave a voicemail as directed and we will return your call as soon as possible. Messages left after 4 pm will be answered the following business day.   You may also send us a message via MyChart. We typically respond to MyChart messages within 1-2 business days.  For prescription refills, please ask your pharmacy to contact our office. Our fax number is [phone].  If you have an urgent issue when the clinic is closed that cannot wait until the next business day, you can page your doctor at the number below.    Please note that while we do our best to be available for urgent issues outside of office hours, we are not available 24/7.   If you have an urgent issue and are unable to reach us, you may choose to seek medical care at your doctor's office, retail clinic, urgent care center, or emergency room.  If you have a medical emergency, please immediately call 911 or go to the emergency department.  Pager Numbers  - Dr. Kowalski: [phone]  - Dr. Moye: [phone]  - Dr. Stewart: [phone]  In the event of inclement weather, please call our main line at   [phone] for an update on the status of any delays or closures.  Dermatology Medication Tips: Please keep the boxes that topical medications come in in order to help keep track of the instructions about where and how to use these. Pharmacies typically print the medication instructions only on the boxes and not directly on the medication tubes.   If your medication is too expensive, please contact our office at [phone] option 4 or send us a message through MyChart.   We are unable to tell what your co-pay for medications will be in  advance as this is different depending on your insurance coverage. However, we may be able to find a substitute medication at lower cost or fill out paperwork to get insurance to cover a needed medication.   If a prior authorization is required to get your medication covered by your insurance company, please allow us 1-2 business days to complete this process.  Drug prices often vary depending on where the prescription is filled and some pharmacies may offer cheaper prices.  The website www.goodrx.com contains coupons for medications through different pharmacies. The prices here do not account for what the cost may be with help from insurance (it may be cheaper with your insurance), but the website can give you the price if you did not use any insurance.  - You can print the associated coupon and take it with your prescription to the pharmacy.  - You may also stop by our office during regular business hours and pick up a GoodRx coupon card.  - If you need your prescription sent electronically to a different pharmacy, notify our office through [facility] MyChart or by phone at [phone] option 4.     Si Usted Necesita Algo Despus de Su Visita  Tambin puede enviarnos un mensaje a travs de MyChart. Por lo general respondemos a los mensajes de MyChart en el transcurso de 1 a 2 das hbiles.  Para renovar recetas, por favor pida a su farmacia que se ponga en contacto con nuestra oficina. Nuestro nmero de fax es el [phone].  Si tiene un asunto urgente cuando la clnica est cerrada y que no puede esperar hasta el siguiente da hbil, puede llamar/localizar a su doctor(a) al nmero que aparece a continuacin.   Por favor, tenga en cuenta que aunque hacemos todo lo posible para estar disponibles para asuntos urgentes fuera del horario de oficina, no estamos disponibles las 24 horas del da, los 7 das de la semana.   Si tiene un problema urgente y no puede comunicarse con nosotros, puede  optar por buscar atencin mdica  en el consultorio de su doctor(a), en una clnica privada, en un centro de atencin urgente o en una sala de emergencias.  Si tiene una emergencia mdica, por favor llame inmediatamente al 911 o vaya a la sala de emergencias.  Nmeros de bper  - Dr. Kowalski: [phone]  - Dra. Moye: [phone]  - Dra. Stewart: [phone]  En caso de inclemencias del tiempo, por favor llame a nuestra lnea principal al [phone] para una actualizacin sobre el estado de cualquier retraso o cierre.  Consejos para la medicacin en dermatologa: Por favor, guarde las cajas en las que vienen los medicamentos de uso tpico para ayudarle a seguir las instrucciones sobre dnde y cmo usarlos. Las farmacias generalmente imprimen las instrucciones del medicamento slo en las cajas y no directamente en los tubos del medicamento.   Si su medicamento es muy caro, por favor, pngase en contacto con   nuestra oficina llamando al [phone] y presione la opcin 4 o envenos un mensaje a travs de MyChart.   No podemos decirle cul ser su copago por los medicamentos por adelantado ya que esto es diferente dependiendo de la cobertura de su seguro. Sin embargo, es posible que podamos encontrar un medicamento sustituto a menor costo o llenar un formulario para que el seguro cubra el medicamento que se considera necesario.   Si se requiere una autorizacin previa para que su compaa de seguros cubra su medicamento, por favor permtanos de 1 a 2 das hbiles para completar este proceso.  Los precios de los medicamentos varan con frecuencia dependiendo del lugar de dnde se surte la receta y alguna farmacias pueden ofrecer precios ms baratos.  El sitio web www.goodrx.com tiene cupones para medicamentos de diferentes farmacias. Los precios aqu no tienen en cuenta lo que podra costar con la ayuda del seguro (puede ser ms barato con su seguro), pero el sitio web puede darle el  precio si no utiliz ningn seguro.  - Puede imprimir el cupn correspondiente y llevarlo con su receta a la farmacia.  - Tambin puede pasar por nuestra oficina durante el horario de atencin regular y recoger una tarjeta de cupones de GoodRx.  - Si necesita que su receta se enve electrnicamente a una farmacia diferente, informe a nuestra oficina a travs de MyChart de [place] o por telfono llamando al [phone] y presione la opcin 4.  

## 2022-05-18 NOTE — Progress Notes (Signed)
Follow-Up Visit   Subjective  [name] [name] is a 62 y.o. female who presents for the following: Follow-up (4 month recheck. Sebaceous hyperplasia vs other at superior midline forehead ). The patient has spots, moles and lesions to be evaluated, some may be new or changing and the patient has concerns that these could be cancer.  The following portions of the chart were reviewed this encounter and updated as appropriate:  Tobacco  Allergies  Meds  Problems  Med Hx  Surg Hx  Fam Hx     Review of Systems: No other skin or systemic complaints except as noted in HPI or Assessment and Plan.  Objective  Well appearing patient in no apparent distress; mood and affect are within normal limits.  A focused examination was performed including face, neck. Relevant physical exam findings are noted in the Assessment and Plan.  superior midline forehead 0.3 cm pink papule        Assessment & Plan   Seborrheic Keratoses. Legs - Stuck-on, waxy, tan-brown papules and/or plaques  - Benign-appearing - Discussed benign etiology and prognosis. - Observe - Call for any changes  Actinic Damage - chronic, secondary to cumulative UV radiation exposure/sun exposure over time - diffuse scaly erythematous macules with underlying dyspigmentation - Recommend daily broad spectrum sunscreen SPF 30+ to sun-exposed areas, reapply every 2 hours as needed.  - Recommend staying in the shade or wearing long sleeves, sun glasses (UVA+UVB protection) and wide brim hats (4-inch brim around the entire circumference of the hat). - Call for new or changing lesions.   History of Basal Cell Carcinoma of the Skin. Right neck infra-auricular 08/2021. Superior midline forehead 12/2021 - No evidence of recurrence today - Recommend regular full body skin exams - Recommend daily broad spectrum sunscreen SPF 30+ to sun-exposed areas, reapply every 2 hours as needed.  - Call if any new or changing lesions are noted  between office visits   Neoplasm of skin superior midline forehead Epidermal / dermal shaving  Lesion diameter (cm):  0.3 Informed consent: discussed and consent obtained   Timeout: patient name, date of birth, surgical site, and procedure verified   Procedure prep:  Patient was prepped and draped in usual sterile fashion Prep type:  Isopropyl alcohol Anesthesia: the lesion was anesthetized in a standard fashion   Anesthetic:  1% lidocaine w/ epinephrine 1-100,000 buffered w/ 8.4% NaHCO3 Instrument used: flexible razor blade   Hemostasis achieved with: pressure, aluminum chloride and electrodesiccation   Outcome: patient tolerated procedure well   Post-procedure details: sterile dressing applied and wound care instructions given   Dressing type: bandage and petrolatum    Destruction of lesion Complexity: extensive   Destruction method: electrodesiccation and curettage   Informed consent: discussed and consent obtained   Timeout:  patient name, date of birth, surgical site, and procedure verified Procedure prep:  Patient was prepped and draped in usual sterile fashion Prep type:  Isopropyl alcohol Anesthesia: the lesion was anesthetized in a standard fashion   Anesthetic:  1% lidocaine w/ epinephrine 1-100,000 buffered w/ 8.4% NaHCO3 Curettage performed in three different directions: Yes   Electrodesiccation performed over the curetted area: Yes   Curettage cycles:  3 Lesion length (cm):  0.3 Lesion width (cm):  0.3 Margin per side (cm):  0.1 Final wound size (cm):  0.5 Hemostasis achieved with:  pressure and aluminum chloride Outcome: patient tolerated procedure well with no complications   Post-procedure details: sterile dressing applied and wound care instructions given  Dressing type: bandage and petrolatum    Related Procedures Anatomic Pathology Report  Return for TBSE in 6-12 months.  I, Emelia Salisbury, CMA, am acting as scribe for Sarina Ser, MD. Documentation:  I have reviewed the above documentation for accuracy and completeness, and I agree with the above.  Sarina Ser, MD

## 2022-05-19 ENCOUNTER — Other Ambulatory Visit: Payer: Self-pay | Admitting: Internal Medicine

## 2022-05-19 DIAGNOSIS — R413 Other amnesia: Secondary | ICD-10-CM

## 2022-05-21 ENCOUNTER — Ambulatory Visit
Admission: RE | Admit: 2022-05-21 | Discharge: 2022-05-21 | Disposition: A | Discharge status: Home / Self Care | Payer: BC Managed Care – PPO | Source: Ambulatory Visit | Attending: Internal Medicine | Admitting: Internal Medicine

## 2022-05-21 DIAGNOSIS — R413 Other amnesia: Secondary | ICD-10-CM | POA: Diagnosis not present

## 2022-05-26 ENCOUNTER — Telehealth: Payer: Self-pay

## 2022-05-26 LAB — ANATOMIC PATHOLOGY REPORT

## 2022-05-26 NOTE — Telephone Encounter (Signed)
Discussed pathology results. Patient voiced understanding.

## 2022-05-26 NOTE — Telephone Encounter (Signed)
-----   Message from Ralene Bathe, MD sent at 05/26/2022  9:34 AM EST ----- Diagnosis synopsis: See below: Abnormal  Comment: Specimen 1-Skin Biopsy, Superior Midline Forehead: BASAL CELL CARCINOMA, NODULAR TYPE.  THE DEEP MARGIN IS INVOLVED.  Cancer - BCC Already treated Recheck next visit

## 2022-12-09 ENCOUNTER — Ambulatory Visit (INDEPENDENT_AMBULATORY_CARE_PROVIDER_SITE_OTHER): Payer: BC Managed Care – PPO | Admitting: Dermatology

## 2022-12-09 VITALS — BP 113/81 | HR 71

## 2022-12-09 DIAGNOSIS — I8393 Asymptomatic varicose veins of bilateral lower extremities: Secondary | ICD-10-CM

## 2022-12-09 DIAGNOSIS — W908XXA Exposure to other nonionizing radiation, initial encounter: Secondary | ICD-10-CM | POA: Diagnosis not present

## 2022-12-09 DIAGNOSIS — L814 Other melanin hyperpigmentation: Secondary | ICD-10-CM | POA: Diagnosis not present

## 2022-12-09 DIAGNOSIS — I781 Nevus, non-neoplastic: Secondary | ICD-10-CM

## 2022-12-09 DIAGNOSIS — D229 Melanocytic nevi, unspecified: Secondary | ICD-10-CM

## 2022-12-09 DIAGNOSIS — Z1283 Encounter for screening for malignant neoplasm of skin: Secondary | ICD-10-CM

## 2022-12-09 DIAGNOSIS — L821 Other seborrheic keratosis: Secondary | ICD-10-CM

## 2022-12-09 DIAGNOSIS — L578 Other skin changes due to chronic exposure to nonionizing radiation: Secondary | ICD-10-CM | POA: Diagnosis not present

## 2022-12-09 DIAGNOSIS — Z85828 Personal history of other malignant neoplasm of skin: Secondary | ICD-10-CM

## 2022-12-09 DIAGNOSIS — L820 Inflamed seborrheic keratosis: Secondary | ICD-10-CM | POA: Diagnosis not present

## 2022-12-09 DIAGNOSIS — D1801 Hemangioma of skin and subcutaneous tissue: Secondary | ICD-10-CM

## 2022-12-09 NOTE — Patient Instructions (Signed)

## 2022-12-09 NOTE — Progress Notes (Signed)
Follow-Up Visit   Subjective  [name] [name] is a 62 y.o. female who presents for the following: Skin Cancer Screening and Full Body Skin Exam  The patient presents for Total-Body Skin Exam (TBSE) for skin cancer screening and mole check. The patient has spots, moles and lesions to be evaluated, some may be new or changing and the patient may have concern these could be cancer.    The following portions of the chart were reviewed this encounter and updated as appropriate: medications, allergies, medical history  Review of Systems:  No other skin or systemic complaints except as noted in HPI or Assessment and Plan.  Objective  Well appearing patient in no apparent distress; mood and affect are within normal limits.  A full examination was performed including scalp, head, eyes, ears, nose, lips, neck, chest, axillae, abdomen, back, buttocks, bilateral upper extremities, bilateral lower extremities, hands, feet, fingers, toes, fingernails, and toenails. All findings within normal limits unless otherwise noted below.   Relevant physical exam findings are noted in the Assessment and Plan.  R forearm x 1, R pretibial x 1 (2) Erythematous stuck-on, waxy papule or plaque    Assessment & Plan   SKIN CANCER SCREENING PERFORMED TODAY.  ACTINIC DAMAGE - Chronic condition, secondary to cumulative UV/sun exposure - diffuse scaly erythematous macules with underlying dyspigmentation - Recommend daily broad spectrum sunscreen SPF 30+ to sun-exposed areas, reapply every 2 hours as needed.  - Staying in the shade or wearing long sleeves, sun glasses (UVA+UVB protection) and wide brim hats (4-inch brim around the entire circumference of the hat) are also recommended for sun protection.  - Call for new or changing lesions.  LENTIGINES, SEBORRHEIC KERATOSES, HEMANGIOMAS - Benign normal skin lesions - Benign-appearing - Call for any changes  MELANOCYTIC NEVI - Tan-brown and/or pink-flesh-colored  symmetric macules and papules - Benign appearing on exam today - Observation - Call clinic for new or changing moles - Recommend daily use of broad spectrum spf 30+ sunscreen to sun-exposed areas.   HISTORY OF BASAL CELL CARCINOMA OF THE SKIN - No evidence of recurrence today - Recommend regular full body skin exams - Recommend daily broad spectrum sunscreen SPF 30+ to sun-exposed areas, reapply every 2 hours as needed.  - Call if any new or changing lesions are noted between office visits  Inflamed seborrheic keratosis (2) R forearm x 1, R pretibial x 1  Symptomatic, irritating, patient would like treated. RTC if not resolved in 2 months.   Destruction of lesion - R forearm x 1, R pretibial x 1 (2) Complexity: simple   Destruction method: cryotherapy   Informed consent: discussed and consent obtained   Timeout:  patient name, date of birth, surgical site, and procedure verified Lesion destroyed using liquid nitrogen: Yes   Region frozen until ice ball extended beyond lesion: Yes   Outcome: patient tolerated procedure well with no complications   Post-procedure details: wound care instructions given    Skin cancer screening  Actinic skin damage  History of basal cell carcinoma  Lentigo  Melanocytic nevus, unspecified location  Seborrheic keratosis  Varicose Veins/Spider Veins - Dilated blue, purple or red veins at the lower extremities - Reassured - Smaller vessels can be treated by sclerotherapy (a procedure to inject a medicine into the veins to make them disappear) if desired, but the treatment is not covered by insurance. Larger vessels may be covered if symptomatic and we would refer to vascular surgeon if treatment desired.  Return in about  1 year (around 12/09/2023) for TBSE.  Maylene Roes, CMA, am acting as scribe for Armida Sans, MD .   Documentation: I have reviewed the above documentation for accuracy and completeness, and I agree with the  above.  Armida Sans, MD

## 2022-12-11 ENCOUNTER — Encounter: Payer: Self-pay | Admitting: Dermatology

## 2023-03-18 NOTE — Progress Notes (Signed)
PCP: Marguarite Arbour, MD   Chief Complaint  Patient presents with   Gynecologic Exam    No concerns    HPI:      [name] is a 62 y.o. [phone] whose LMP was No LMP recorded. Patient has had a hysterectomy., presents today for her annual examination.  Her menses are absent due to TAH for leio/AUB/pelvic pain. She does not have PMB. She does not have vasomotor sx.   Sex activity: not sexually active. She does not have vaginal dryness/pain.  Last Pap: 03/10/22 Results were: unsatisfactory /neg HPV DNA. Repeat due today.   Last mammogram: 03/17/22  Results were: normal--routine follow-up in 12 months There is no FH of breast cancer. There is a FH of ovarian cancer in her pat aunt. There is also a FH of uterine, colon and brain cancer in her pat aunts. Pt is MyRisk neg 5/21; IBIS=5.6%. The patient does self-breast exams.   Colonoscopy: 2018 and 5/23 at [facility]; Repeat due after 5 years per pt.   Tobacco use: The patient denies current or previous tobacco use. Alcohol use: none  No drug use Exercise: min active  She does get adequate calcium and Vitamin D in her diet.  Labs with PCP.  Past Medical History:  Diagnosis Date   Basal cell carcinoma 09/10/2021   right neck infra auricular, [facility]   Basal cell carcinoma 01/01/2022   Superior Midline Forehead, [facility]   Basal cell carcinoma 05/18/2022   Superior mid line forehead. Nodular. [facility].   BRCA negative 07/2019   MyRisk neg; IBIS=5.6%   Family history of ovarian cancer    Hx of blood clots    superficial veins (phlebitis) and currently under control    Hypercholesterolemia    Hyperlipidemia    Irritable bowel syndrome (IBS)    Lactose intolerance    Migraine    Phlebitis and thrombophlebitis of lower extremities, unspecified    Restless leg syndrome    bilateral but L>R. Currently not taking medication 2/2 upsetting stomach   Sleep apnea    sleep study/ CPAP fitting pending in 01/2016    Past Surgical History:   Procedure Laterality Date   ABDOMINAL HYSTERECTOMY     APPENDECTOMY     CHOLECYSTECTOMY     TUBAL LIGATION      Family History  Problem Relation Age of Onset   Hypertension Mother    Diabetes Mother        type 2   Stroke Mother    Heart disease Mother    Hypertension Father    COPD Father    Heart disease Father    Emphysema Father    Arthritis Sister    Heart disease Sister    Hypercholesterolemia Sister        x2   Emphysema Sister    Colitis Sister    Arthritis Sister    Hypercholesterolemia Sister    Basal cell carcinoma Sister    Osteoporosis Sister    Ovarian cancer Paternal Aunt        not sure of age   Uterine cancer Paternal Aunt        not sure of age   Colon cancer Paternal Aunt        not sure   Brain cancer Paternal Aunt    Diabetes Maternal Grandmother        type 2   Heart disease Maternal Grandmother    Skin cancer Maternal Grandmother    Cancer Other  testicular   Lymphoma Other    Breast cancer Neg Hx     Social History   Socioeconomic History   Marital status: Married    Spouse name: Not on file   Number of children: Not on file   Years of education: Not on file   Highest education level: Not on file  Occupational History   Not on file  Tobacco Use   Smoking status: Never    Passive exposure: Never   Smokeless tobacco: Never  Vaping Use   Vaping status: Never Used  Substance and Sexual Activity   Alcohol use: No   Drug use: No   Sexual activity: Not Currently    Birth control/protection: Surgical    Comment: Hysterectomy  Other Topics Concern   Not on file  Social History Narrative   Not on file   Social Drivers of Health   Financial Resource Strain: Low Risk  (02/12/2023)   Received from [facility]   Overall Financial Resource Strain (CARDIA)    Difficulty of Paying Living Expenses: Not hard at all  Food Insecurity: No Food Insecurity (02/12/2023)   [facility]   Hunger Vital Sign    Worried About Running Out of Food in the Last Year: Never true    Ran Out of Food in the Last Year: Never true  Transportation Needs: No Transportation Needs (02/12/2023)   Received from [facility] - Transportation    In the past 12 months, has lack of transportation kept you from medical appointments or from getting medications?: No    Lack of Transportation (Non-Medical): No  Physical Activity: Not on file  Stress: Not on file  Social Connections: Not on file  Intimate Partner Violence: Not on file     Current Outpatient Medications:    Calcium Citrate-Vitamin D 315-5 MG-MCG TABS, Take by mouth., Disp: , Rfl:    carbidopa-levodopa (SINEMET CR) 50-200 MG tablet, Take 1 tablet by mouth at bedtime., Disp: , Rfl:    Multiple Vitamin (MULTI-VITAMIN DAILY PO), Take by mouth., Disp: , Rfl:    phentermine (ADIPEX-P) 37.5 MG tablet, Take 37.5 mg by mouth daily before breakfast., Disp: , Rfl:    rosuvastatin (CRESTOR) 10 MG tablet, Take 1 tablet by mouth daily., Disp: , Rfl:      ROS:  Review of Systems  Constitutional:  Negative for fatigue, fever and unexpected weight change.  Respiratory:  Negative for cough, shortness of breath and wheezing.   Cardiovascular:  Negative for chest pain, palpitations and leg swelling.  Gastrointestinal:  Negative for blood in stool, constipation, diarrhea, nausea and vomiting.  Endocrine: Negative for cold intolerance, heat intolerance and polyuria.  Genitourinary:  Negative for dyspareunia, dysuria, flank pain, frequency, genital sores, hematuria, menstrual problem, pelvic pain, urgency, vaginal bleeding, vaginal discharge and vaginal pain.  Musculoskeletal:  Negative for back pain, joint swelling and myalgias.  Skin:  Negative for rash.  Neurological:  Negative for dizziness, syncope, light-headedness, numbness and headaches.  Hematological:  Negative for adenopathy.  Psychiatric/Behavioral:   Negative for agitation, confusion, sleep disturbance and suicidal ideas. The patient is not nervous/anxious.    BREAST: No symptoms    Objective: BP 120/78   Ht 5\' 4"  (1.626 m)   Wt 182 lb (82.6 kg)   BMI 31.24 kg/m    Physical Exam Constitutional:      Appearance: She is well-developed.  Genitourinary:     Vulva normal.  Genitourinary Comments: UTERUS/CX SURG REM     Right Labia: No rash, tenderness or lesions.    Left Labia: No tenderness, lesions or rash.    Vaginal cuff intact.    No vaginal discharge, erythema or tenderness.      Right Adnexa: not tender and no mass present.    Left Adnexa: not tender and no mass present.    Cervix is absent.     Uterus is absent.  Breasts:    Right: No mass, nipple discharge, skin change or tenderness.     Left: No mass, nipple discharge, skin change or tenderness.  Neck:     Thyroid: No thyromegaly.  Cardiovascular:     Rate and Rhythm: Normal rate and regular rhythm.     Heart sounds: Normal heart sounds. No murmur heard. Pulmonary:     Effort: Pulmonary effort is normal.     Breath sounds: Normal breath sounds.  Abdominal:     Palpations: Abdomen is soft.     Tenderness: There is no abdominal tenderness. There is no guarding.  Musculoskeletal:        General: Normal range of motion.     Cervical back: Normal range of motion.  Neurological:     General: No focal deficit present.     Mental Status: She is alert and oriented to person, place, and time.     Cranial Nerves: No cranial nerve deficit.  Skin:    General: Skin is warm and dry.  Psychiatric:        Mood and Affect: Mood normal.        Behavior: Behavior normal.        Thought Content: Thought content normal.        Judgment: Judgment normal.  Vitals reviewed.     Assessment/Plan: Encounter for annual routine gynecological examination  Unsatisfactory cytologic smear of vagina - Plan: IGP, rfx Aptima HPV ASCU  Screening for vaginal cancer - Plan:  IGP, rfx Aptima HPV ASCU  Encounter for screening mammogram for malignant neoplasm of breast - Plan: MM 3D SCREENING MAMMOGRAM BILATERAL BREAST; pt to schedule mammo   GYN counsel breast self exam, mammography screening, menopause, adequate intake of calcium and vitamin D, diet and exercise    F/U  Return in about 1 year (around 03/18/2024).  Hunt Zajicek B. Julez Huseby, PA-C 03/19/2023 1:35 PM

## 2023-03-19 ENCOUNTER — Encounter: Payer: Self-pay | Admitting: Obstetrics and Gynecology

## 2023-03-19 ENCOUNTER — Ambulatory Visit (INDEPENDENT_AMBULATORY_CARE_PROVIDER_SITE_OTHER): Payer: BC Managed Care – PPO | Admitting: Obstetrics and Gynecology

## 2023-03-19 VITALS — BP 120/78 | Ht 64.0 in | Wt 182.0 lb

## 2023-03-19 DIAGNOSIS — R87625 Unsatisfactory cytologic smear of vagina: Secondary | ICD-10-CM

## 2023-03-19 DIAGNOSIS — Z01419 Encounter for gynecological examination (general) (routine) without abnormal findings: Secondary | ICD-10-CM

## 2023-03-19 DIAGNOSIS — Z1272 Encounter for screening for malignant neoplasm of vagina: Secondary | ICD-10-CM

## 2023-03-19 DIAGNOSIS — Z1231 Encounter for screening mammogram for malignant neoplasm of breast: Secondary | ICD-10-CM

## 2023-03-19 NOTE — Patient Instructions (Addendum)
I value your feedback and you entrusting us with your care. If you get a [facility] patient survey, I would appreciate you taking the time to let us know about your experience today. Thank you!  [facility] ([place])--[phone]  

## 2023-03-31 LAB — IGP, RFX APTIMA HPV ASCU

## 2023-03-31 LAB — HPV APTIMA: HPV Aptima: NEGATIVE

## 2023-04-01 ENCOUNTER — Encounter: Payer: Self-pay | Admitting: Obstetrics and Gynecology

## 2023-04-09 ENCOUNTER — Ambulatory Visit
Admission: RE | Admit: 2023-04-09 | Discharge: 2023-04-09 | Disposition: A | Discharge status: Home / Self Care | Payer: BC Managed Care – PPO | Source: Ambulatory Visit | Attending: Obstetrics and Gynecology | Admitting: Obstetrics and Gynecology

## 2023-04-09 DIAGNOSIS — Z1231 Encounter for screening mammogram for malignant neoplasm of breast: Secondary | ICD-10-CM | POA: Diagnosis present

## 2023-04-13 ENCOUNTER — Encounter: Payer: Self-pay | Admitting: Obstetrics and Gynecology

## 2023-06-04 IMAGING — MG MM DIGITAL SCREENING BILAT W/ TOMO AND CAD
8 series · 8 of 24 positions shown · non-contrast
Comparison: Previous exam(s).

CLINICAL DATA: Screening.

EXAM:
DIGITAL SCREENING BILATERAL MAMMOGRAM WITH TOMOSYNTHESIS AND CAD
TECHNIQUE: Bilateral screening digital craniocaudal and mediolateral oblique
mammograms were obtained. Bilateral screening digital breast
tomosynthesis was performed. The images were evaluated with
computer-aided detection.

[L CC synth-2D]
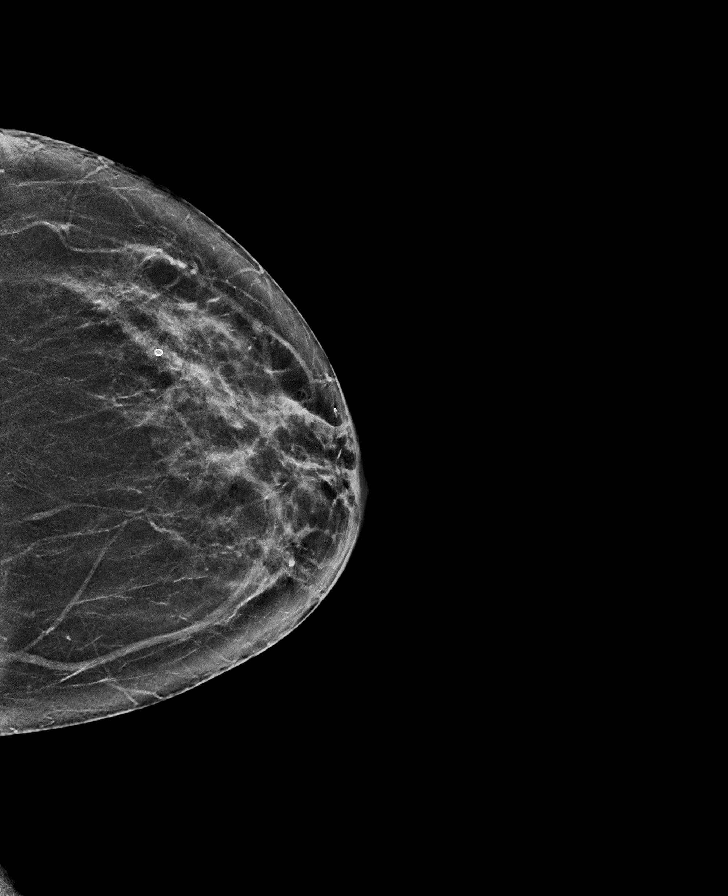

[R MLO synth-2D]
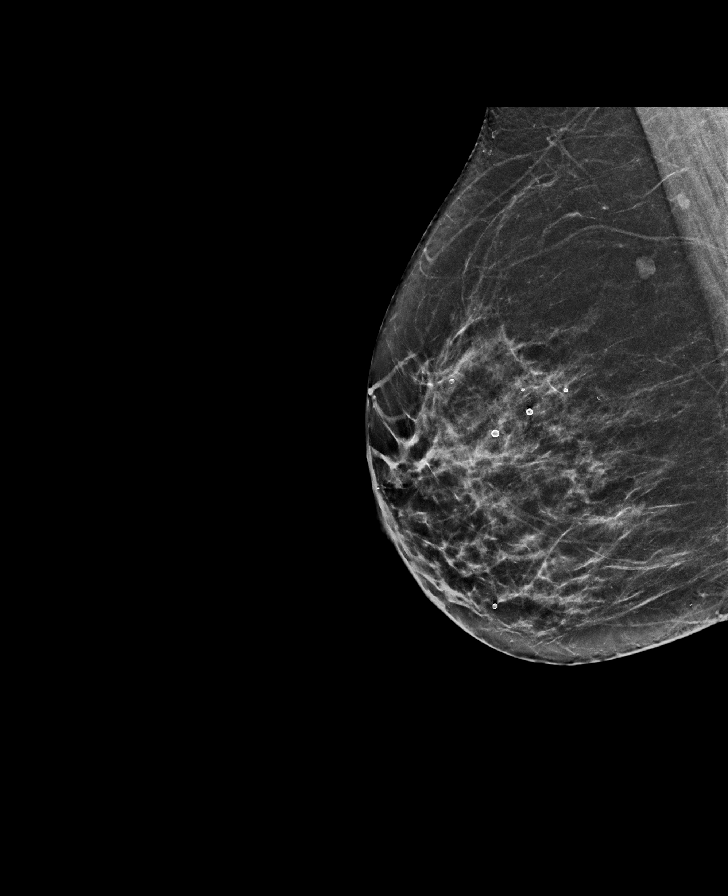

[L MLO synth-2D]
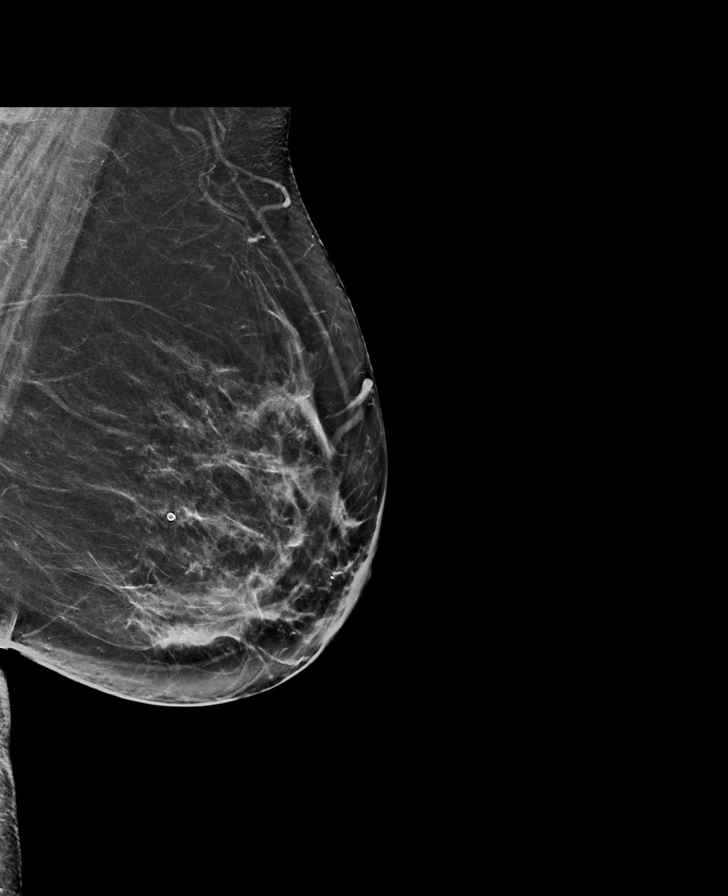

[R CC synth-2D]
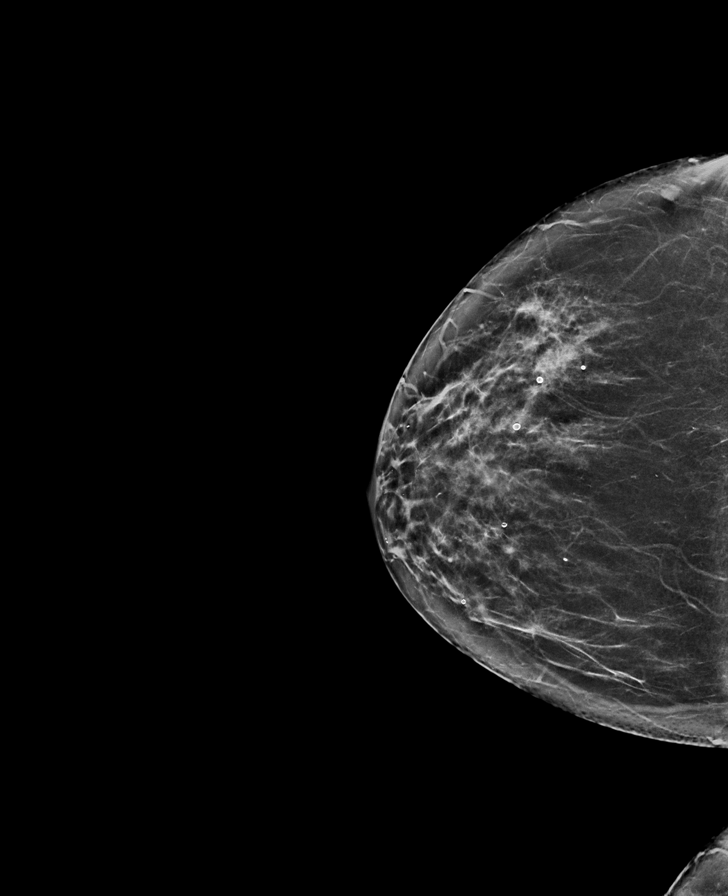

[L MLO tomo · tomo slice 37/72.0]
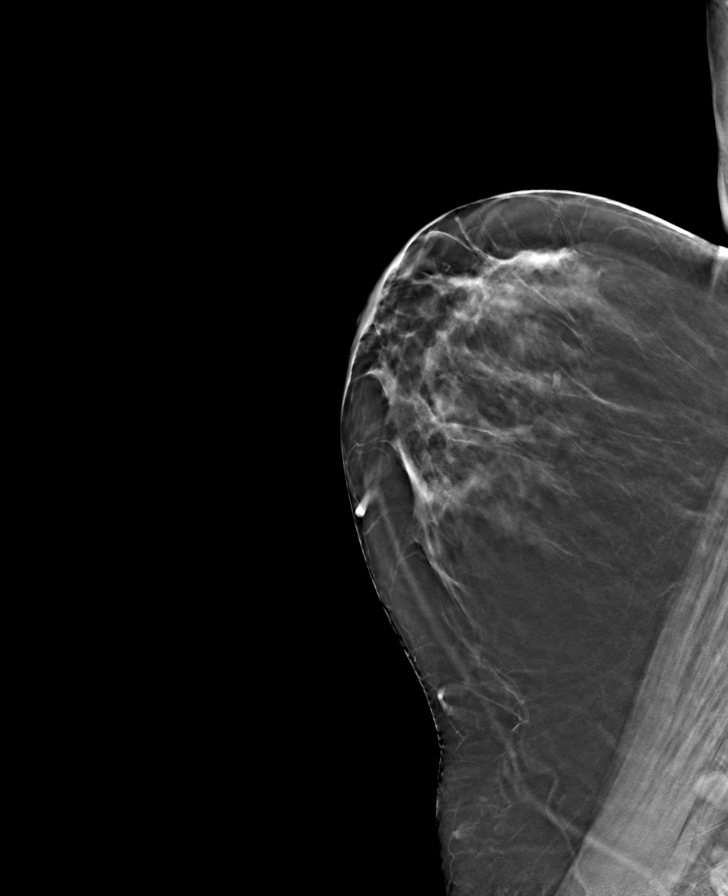

[R CC tomo · tomo slice 34/67.0]
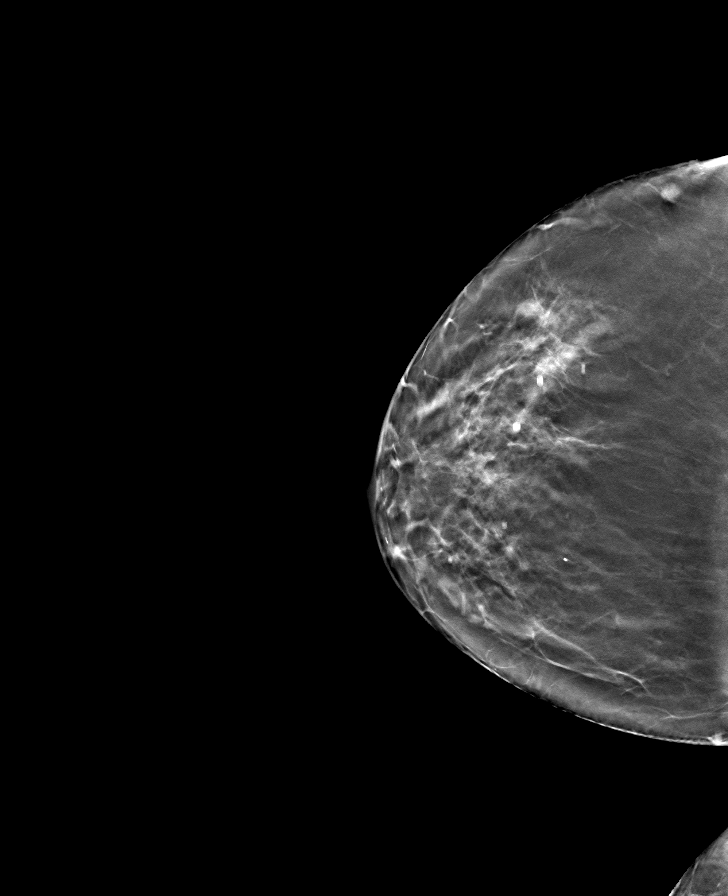

[L CC tomo · tomo slice 35/68.0]
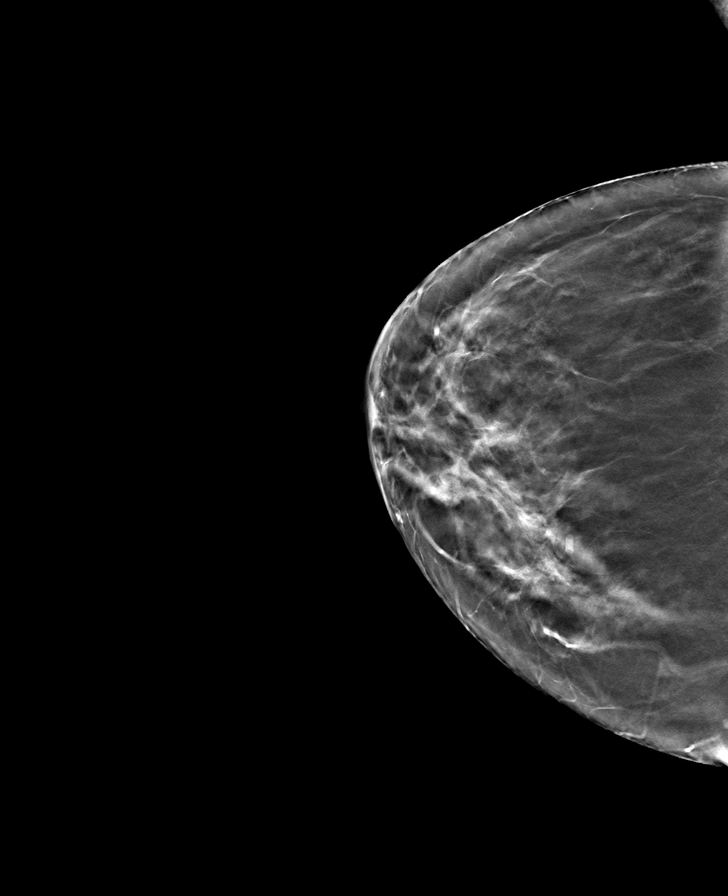

[R MLO tomo · tomo slice 35/68.0]
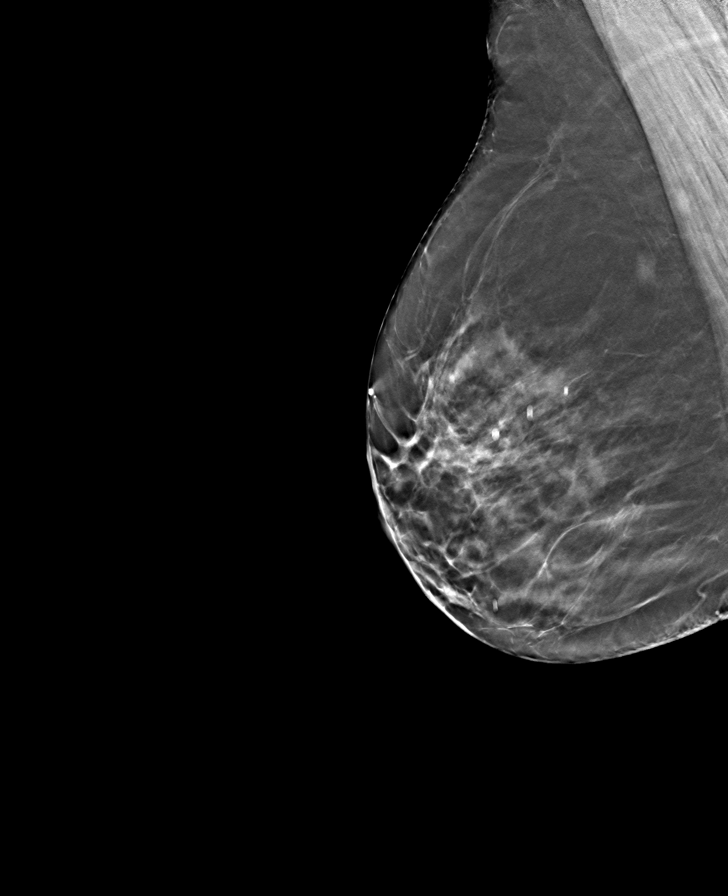

[8 of 24 positions shown; findings below may reference images not displayed]

ACR Breast Density Category c: The breast tissue is heterogeneously
dense, which may obscure small masses.
FINDINGS: There are no findings suspicious for malignancy.
IMPRESSION: No mammographic evidence of malignancy. A result letter of this
screening mammogram will be mailed directly to the patient.

RECOMMENDATION:
Screening mammogram in one year. (Code:[id number])

BI-RADS CATEGORY  1: Negative.

## 2023-12-03 ENCOUNTER — Ambulatory Visit: Admitting: Dietician

## 2023-12-07 ENCOUNTER — Encounter: Attending: Internal Medicine | Admitting: Dietician

## 2023-12-07 ENCOUNTER — Encounter: Payer: Self-pay | Admitting: Dietician

## 2023-12-07 VITALS — Ht 64.0 in | Wt 185.4 lb

## 2023-12-07 DIAGNOSIS — E669 Obesity, unspecified: Secondary | ICD-10-CM

## 2023-12-07 NOTE — Progress Notes (Signed)
 Medical Nutrition Therapy  Appointment Start time:  [phone]  Appointment End time:  1015  Primary concerns today: Weight Loss, Improving health  Referral diagnosis: E66.811 (ICD-10-CM) - Obesity, class 1, E66.09 (ICD-10-CM) - Other obesity due to excess calories, E78.00 (ICD-10-CM) - Pure hypercholesterolemia, unspecified Preferred learning style: Auditory, visual, hands on Learning readiness: Ready   NUTRITION ASSESSMENT   Anthropometrics: Ht: 64 Wt: 185.4 lbs BMI: 31.82 kg/m2 Wt Change: +10-15 lbs over last 12 months  Clinical Medical Hx: HLD, OSA, Obesity, Hypothyroidism, IBS, Lactose intolerance Medications: Rosuvastatin, Carbidopa/Levodopa Labs: Cholesterol - 211, LDL - 125, A1c - 5.5% Notable Signs/Symptoms: Fatigue   Lifestyle & Dietary Hx Pt reports desire to lose weight and improve cholesterol. Pt reports occasional sugar cravings, may have a cookie or cake to satisfy them. Pt reports history of trying phentermine and weight watchers for weight loss, lost to 155 lbs and stayed there for about a year and a half before gaining it back.  Pt states they are having issues with dentition, had a molar break and needed a crown, pt reports dentist says they need 3 more crowns, unable to eat hard foods (nuts, carrots, ice)  Pt reports increasing fatigue during the middle of the day over the last 2 years. 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 Pt reports having an elliptical at home, states they get very winded within 5-7 minutes of exercising and feels chest pain, this keeps them from using the elliptical.    Estimated daily fluid intake: 48 oz Supplements: Women's MVI, Magnesium, B12, Iron+C Sleep: Always wakes up tired, OSA on CPAP, sleeps 7 hours Stress / self-care: Low overall, occasional minor work stress Current average weekly physical activity: ADLs, walks dog    24-Hr  Dietary Recall First Meal: [place] Almond butter biscuits Snack: Small brownie, small slice of confetti cake (Donated blood) Second Meal: PB and Jelly, white bread Snack:  Third Meal: 2 chicken strips w/ [place] dressing, boiled potato w/ a little butter Snack: 1 Oatmeal energy bite Beverages: Unsweet Iced tea, water    NUTRITION DIAGNOSIS  NB-1.1 Food and nutrition-related knowledge deficit As related to obesity.  As evidenced by BMI of 31.82 kg/m2, history of over consumption of energy dense foods/sweets.   NUTRITION INTERVENTION  Nutrition education (E-1) on the following topics:  Educated patient on the two components of energy balance: Energy in (calories), and energy out (activity). Explain the role of negative energy balance in weight loss. Discussed options with patient to achieve a negative energy balance and how to best control energy in and energy out to accommodate their lifestyle. Educate pt on the difference between LDL and HDL cholesterol.  Educate pt on the factors that can increase and decrease HDL cholesterol, including exercise to increase HDL, and tobacco use to decrease HDL.  Educate pt on factors that can elevate LDL cholesterol, including high dietary intake of saturated fats. Educate pt on identifying sources of saturated fats, and how to make alternative food choices to lower saturated fat intake. Educate pt on the role of soluble fiber in binding to cholesterol in the GI tract an eliminating it from the body. Educate pt on dietary sources of soluble fiber.  Educate on the role of elevated LDL,total cholesterol, and triglycerides on cardiovascular health. Educate pt on the role of physical activity in lowering LDL and increasing HDL cholesterol.  Handouts Provided Include  Balanced Plate Snack  Sheet  Learning Style & Readiness for Change Teaching method utilized: Visual & Auditory  Demonstrated degree of understanding via: Teach Back  Barriers to  learning/adherence to lifestyle change: None  Goals Established by Pt Work on eating small meals around every 3 hours or so. Make as many of these high in fiber and protein! Begin to recognize carbohydrates, proteins, and non-starchy vegetables in your food choices! Begin to build your meals using the proportions of the Balanced Plate. First, select your carb choice(s) for the meal. Make this 25% of your meal. Choose whole grain products as often as possible! Next, select your source of protein to pair with your carb choice(s). Make this another 25% of your meal. Choose LEAN proteins as often as possible! Finally, complete your meal with a variety of non-starchy vegetables. Make this the remaining 50% of your meal. Begin to look into treadmill options for your house this fall. We will look into increasing your activity moving forward.   MONITORING & EVALUATION Dietary intake, weekly physical activity, and weight change in 2 months.  Next Steps  Patient is to follow up with RD.

## 2023-12-07 NOTE — Patient Instructions (Addendum)
 Work on eating small meals around every 3 hours or so. Make as many of these high in fiber and protein!  Begin to recognize carbohydrates, proteins, and non-starchy vegetables in your food choices!  Begin to build your meals using the proportions of the Balanced Plate. First, select your carb choice(s) for the meal. Make this 25% of your meal. Choose whole grain products as often as possible! Next, select your source of protein to pair with your carb choice(s). Make this another 25% of your meal. Choose LEAN proteins as often as possible! Finally, complete your meal with a variety of non-starchy vegetables. Make this the remaining 50% of your meal.  Begin to look into treadmill options for your house this fall. We will look into increasing your activity moving forward.

## 2023-12-09 ENCOUNTER — Ambulatory Visit: Payer: BC Managed Care – PPO | Admitting: Dermatology

## 2023-12-20 ENCOUNTER — Ambulatory Visit: Admitting: Dermatology

## 2023-12-20 DIAGNOSIS — L821 Other seborrheic keratosis: Secondary | ICD-10-CM

## 2023-12-20 DIAGNOSIS — L570 Actinic keratosis: Secondary | ICD-10-CM

## 2023-12-20 DIAGNOSIS — D229 Melanocytic nevi, unspecified: Secondary | ICD-10-CM

## 2023-12-20 DIAGNOSIS — Z1283 Encounter for screening for malignant neoplasm of skin: Secondary | ICD-10-CM | POA: Diagnosis not present

## 2023-12-20 DIAGNOSIS — L578 Other skin changes due to chronic exposure to nonionizing radiation: Secondary | ICD-10-CM

## 2023-12-20 DIAGNOSIS — W908XXA Exposure to other nonionizing radiation, initial encounter: Secondary | ICD-10-CM

## 2023-12-20 DIAGNOSIS — L820 Inflamed seborrheic keratosis: Secondary | ICD-10-CM

## 2023-12-20 DIAGNOSIS — Z79899 Other long term (current) drug therapy: Secondary | ICD-10-CM

## 2023-12-20 DIAGNOSIS — B353 Tinea pedis: Secondary | ICD-10-CM

## 2023-12-20 DIAGNOSIS — Z85828 Personal history of other malignant neoplasm of skin: Secondary | ICD-10-CM

## 2023-12-20 DIAGNOSIS — L814 Other melanin hyperpigmentation: Secondary | ICD-10-CM

## 2023-12-20 DIAGNOSIS — D1801 Hemangioma of skin and subcutaneous tissue: Secondary | ICD-10-CM

## 2023-12-20 DIAGNOSIS — Z7189 Other specified counseling: Secondary | ICD-10-CM

## 2023-12-20 MED ORDER — KETOCONAZOLE 2 % EX CREA: TOPICAL_CREAM | CUTANEOUS | 11 refills | Status: AC

## 2023-12-20 NOTE — Progress Notes (Unsigned)
 Follow-Up Visit   Subjective  [name] [name] is a 63 y.o. female who presents for the following: Skin Cancer Screening and Full Body Skin Exam Hx of bcc, hx of isks  The patient presents for Total-Body Skin Exam (TBSE) for skin cancer screening and mole check. The patient has spots, moles and lesions to be evaluated, some may be new or changing and the patient may have concern these could be cancer.  The following portions of the chart were reviewed this encounter and updated as appropriate: medications, allergies, medical history  Review of Systems:  No other skin or systemic complaints except as noted in HPI or Assessment and Plan.  Objective  Well appearing patient in no apparent distress; mood and affect are within normal limits.  A full examination was performed including scalp, head, eyes, ears, nose, lips, neck, chest, axillae, abdomen, back, buttocks, bilateral upper extremities, bilateral lower extremities, hands, feet, fingers, toes, fingernails, and toenails. All findings within normal limits unless otherwise noted below.   Relevant physical exam findings are noted in the Assessment and Plan.  right forehead at hairline x 1, left leg x 1 (2) Erythematous stuck-on, waxy papule or plaque right forehead x 2 (2) Erythematous thin papules/macules with gritty scale.   Assessment & Plan   SKIN CANCER SCREENING PERFORMED TODAY.  ACTINIC DAMAGE - Chronic condition, secondary to cumulative UV/sun exposure - diffuse scaly erythematous macules with underlying dyspigmentation - Recommend daily broad spectrum sunscreen SPF 30+ to sun-exposed areas, reapply every 2 hours as needed.  - Staying in the shade or wearing long sleeves, sun glasses (UVA+UVB protection) and wide brim hats (4-inch brim around the entire circumference of the hat) are also recommended for sun protection.  - Call for new or changing lesions.  LENTIGINES, SEBORRHEIC KERATOSES, HEMANGIOMAS - Benign normal skin  lesions - Benign-appearing - Call for any changes  MELANOCYTIC NEVI - Tan-brown and/or pink-flesh-colored symmetric macules and papules - Benign appearing on exam today - Observation - Call clinic for new or changing moles - Recommend daily use of broad spectrum spf 30+ sunscreen to sun-exposed areas.   TINEA PEDIS Chronic and persistent condition with duration or expected duration over one year. Condition is symptomatic / bothersome to patient. Not to goal. Exam: Scaling and maceration web spaces and over distal and lateral soles at b/l feet  Treatment Plan: Start ketoconazole cream apply topically to feet at bedtime for 6 weeks    HISTORY OF BASAL CELL CARCINOMA OF THE SKIN 05/18/2022 superior mid line forehead - nodular ED&C 01/01/2022 - superior midline forehead ED&C 09/10/2021 - right neck infraauricular  ED&C - No evidence of recurrence today - Recommend regular full body skin exams - Recommend daily broad spectrum sunscreen SPF 30+ to sun-exposed areas, reapply every 2 hours as needed.  - Call if any new or changing lesions are noted between office visits  INFLAMED SEBORRHEIC KERATOSIS (2) right forehead at hairline x 1, left leg x 1 (2) Symptomatic, irritating, patient would like treated. Destruction of lesion - right forehead at hairline x 1, left leg x 1 (2) Complexity: simple   Destruction method: cryotherapy   Informed consent: discussed and consent obtained   Timeout:  patient name, date of birth, surgical site, and procedure verified Lesion destroyed using liquid nitrogen: Yes   Region frozen until ice ball extended beyond lesion: Yes   Outcome: patient tolerated procedure well with no complications   Post-procedure details: wound care instructions given    ACTINIC KERATOSIS (2)  right forehead x 2 (2) Actinic keratoses are precancerous spots that appear secondary to cumulative UV radiation exposure/sun exposure over time. They are chronic with expected duration  over 1 year. A portion of actinic keratoses will progress to squamous cell carcinoma of the skin. It is not possible to reliably predict which spots will progress to skin cancer and so treatment is recommended to prevent development of skin cancer.  Recommend daily broad spectrum sunscreen SPF 30+ to sun-exposed areas, reapply every 2 hours as needed.  Recommend staying in the shade or wearing long sleeves, sun glasses (UVA+UVB protection) and wide brim hats (4-inch brim around the entire circumference of the hat). Call for new or changing lesions. Destruction of lesion - right forehead x 2 (2) Complexity: simple   Destruction method: cryotherapy   Informed consent: discussed and consent obtained   Timeout:  patient name, date of birth, surgical site, and procedure verified Lesion destroyed using liquid nitrogen: Yes   Region frozen until ice ball extended beyond lesion: Yes   Outcome: patient tolerated procedure well with no complications   Post-procedure details: wound care instructions given    TINEA PEDIS OF BOTH FEET   Related Medications ketoconazole (NIZORAL) 2 % cream Apply topically to feet nightly for foot fungus Return in about 1 year (around 12/19/2024) for TBSE.  IEleanor Blush, CMA, am acting as scribe for Alm Rhyme, MD.   Documentation: I have reviewed the above documentation for accuracy and completeness, and I agree with the above.  Alm Rhyme, MD

## 2023-12-20 NOTE — Patient Instructions (Addendum)
 Cryotherapy Aftercare  Wash gently with soap and water everyday.   Apply Vaseline and Band-Aid daily until healed.     Actinic keratoses are precancerous spots that appear secondary to cumulative UV radiation exposure/sun exposure over time. They are chronic with expected duration over 1 year. A portion of actinic keratoses will progress to squamous cell carcinoma of the skin. It is not possible to reliably predict which spots will progress to skin cancer and so treatment is recommended to prevent development of skin cancer.  Recommend daily broad spectrum sunscreen SPF 30+ to sun-exposed areas, reapply every 2 hours as needed.  Recommend staying in the shade or wearing long sleeves, sun glasses (UVA+UVB protection) and wide brim hats (4-inch brim around the entire circumference of the hat). Call for new or changing lesions.     Seborrheic Keratosis  What causes seborrheic keratoses? Seborrheic keratoses are harmless, common skin growths that first appear during adult life.  As time goes by, more growths appear.  Some people may develop a large number of them.  Seborrheic keratoses appear on both covered and uncovered body parts.  They are not caused by sunlight.  The tendency to develop seborrheic keratoses can be inherited.  They vary in color from skin-colored to gray, brown, or even black.  They can be either smooth or have a rough, warty surface.   Seborrheic keratoses are superficial and look as if they were stuck on the skin.  Under the microscope this type of keratosis looks like layers upon layers of skin.  That is why at times the top layer may seem to fall off, but the rest of the growth remains and re-grows.    Treatment Seborrheic keratoses do not need to be treated, but can easily be removed in the office.  Seborrheic keratoses often cause symptoms when they rub on clothing or jewelry.  Lesions can be in the way of shaving.  If they become inflamed, they can cause itching,  soreness, or burning.  Removal of a seborrheic keratosis can be accomplished by freezing, burning, or surgery. If any spot bleeds, scabs, or grows rapidly, please return to have it checked, as these can be an indication of a skin cancer.   Melanoma ABCDEs  Melanoma is the most dangerous type of skin cancer, and is the leading cause of death from skin disease.  You are more likely to develop melanoma if you: Have light-colored skin, light-colored eyes, or red or blond hair Spend a lot of time in the sun Tan regularly, either outdoors or in a tanning bed Have had blistering sunburns, especially during childhood Have a close family member who has had a melanoma Have atypical moles or large birthmarks  Early detection of melanoma is key since treatment is typically straightforward and cure rates are extremely high if we catch it early.   The first sign of melanoma is often a change in a mole or a new dark spot.  The ABCDE system is a way of remembering the signs of melanoma.  A for asymmetry:  The two halves do not match. B for border:  The edges of the growth are irregular. C for color:  A mixture of colors are present instead of an even brown color. D for diameter:  Melanomas are usually (but not always) greater than 6mm - the size of a pencil eraser. E for evolution:  The spot keeps changing in size, shape, and color.  Please check your skin once per month between visits. You  can use a small mirror in front and a large mirror behind you to keep an eye on the back side or your body.   If you see any new or changing lesions before your next follow-up, please call to schedule a visit.  Please continue daily skin protection including broad spectrum sunscreen SPF 30+ to sun-exposed areas, reapplying every 2 hours as needed when you're outdoors.   Staying in the shade or wearing long sleeves, sun glasses (UVA+UVB protection) and wide brim hats (4-inch brim around the entire circumference of  the hat) are also recommended for sun protection.     Due to recent changes in healthcare laws, you may see results of your pathology and/or laboratory studies on MyChart before the doctors have had a chance to review them. We understand that in some cases there may be results that are confusing or concerning to you. Please understand that not all results are received at the same time and often the doctors may need to interpret multiple results in order to provide you with the best plan of care or course of treatment. Therefore, we ask that you please give us  2 business days to thoroughly review all your results before contacting the office for clarification. Should we see a critical lab result, you will be contacted sooner.   If You Need Anything After Your Visit  If you have any questions or concerns for your doctor, please call our main line at [phone] and press option 4 to reach your doctor's medical assistant. If no one answers, please leave a voicemail as directed and we will return your call as soon as possible. Messages left after 4 pm will be answered the following business day.   You may also send us  a message via MyChart. We typically respond to MyChart messages within 1-2 business days.  For prescription refills, please ask your pharmacy to contact our office. Our fax number is [phone].  If you have an urgent issue when the clinic is closed that cannot wait until the next business day, you can page your doctor at the number below.    Please note that while we do our best to be available for urgent issues outside of office hours, we are not available 24/7.   If you have an urgent issue and are unable to reach us , you may choose to seek medical care at your doctor's office, retail clinic, urgent care center, or emergency room.  If you have a medical emergency, please immediately call 911 or go to the emergency department.  Pager Numbers  - Dr. Hester: [phone]  -  Dr. Jackquline: [phone]  - Dr. Claudene: [phone]   - Dr. Raymund: [phone]  In the event of inclement weather, please call our main line at [phone] for an update on the status of any delays or closures.  Dermatology Medication Tips: Please keep the boxes that topical medications come in in order to help keep track of the instructions about where and how to use these. Pharmacies typically print the medication instructions only on the boxes and not directly on the medication tubes.   If your medication is too expensive, please contact our office at [phone] option 4 or send us  a message through MyChart.   We are unable to tell what your co-pay for medications will be in advance as this is different depending on your insurance coverage. However, we may be able to find a substitute medication at lower cost or fill out paperwork to get insurance  to cover a needed medication.   If a prior authorization is required to get your medication covered by your insurance company, please allow us  1-2 business days to complete this process.  Drug prices often vary depending on where the prescription is filled and some pharmacies may offer cheaper prices.  The website www.goodrx.com contains coupons for medications through different pharmacies. The prices here do not account for what the cost may be with help from insurance (it may be cheaper with your insurance), but the website can give you the price if you did not use any insurance.  - You can print the associated coupon and take it with your prescription to the pharmacy.  - You may also stop by our office during regular business hours and pick up a GoodRx coupon card.  - If you need your prescription sent electronically to a different pharmacy, notify our office through [facility] or by phone at [phone] option 4.     Si Usted Necesita Algo Despus de Su Visita  Tambin puede enviarnos un mensaje a travs de Clinical cytogeneticist. Por lo  general respondemos a los mensajes de MyChart en el transcurso de 1 a 2 das hbiles.  Para renovar recetas, por favor pida a su farmacia que se ponga en contacto con nuestra oficina. Randi lakes de fax es [place] [phone].  Si tiene un asunto urgente cuando la clnica est cerrada y que no puede esperar hasta el siguiente da hbil, puede llamar/localizar a su doctor(a) al nmero que aparece a continuacin.   Por favor, tenga en cuenta que aunque hacemos todo lo posible para estar disponibles para asuntos urgentes fuera del horario de [place], no estamos disponibles las 24 horas del da, los 7 [address]  [address] de la [place].   Si tiene un problema urgente y no puede comunicarse con nosotros, puede optar por buscar atencin mdica  en el consultorio de su doctor(a), en una clnica privada, en un centro de atencin urgente o en una sala de emergencias.  Si tiene Engineer, drilling, por favor llame inmediatamente al 911 o vaya a la sala de emergencias.  Nmeros de bper  - Dr. Hester: [phone]  - Dra. Jackquline: [phone]  - Dr. Claudene: [phone]  - Dra. Kitts: [phone]  En caso de inclemencias del [place], por favor llame a nuestra lnea principal al [phone] para una actualizacin sobre el estado de cualquier retraso o cierre.  Consejos para la medicacin en dermatologa: Por favor, guarde las cajas en las que vienen los medicamentos de uso tpico para ayudarle a seguir las instrucciones sobre dnde y cmo usarlos. Las farmacias generalmente imprimen las instrucciones del medicamento slo en las cajas y no directamente en los tubos del [place].   Si su medicamento es muy caro, por favor, pngase en contacto con landry rieger llamando al [phone] y presione la opcin 4 o envenos un mensaje a travs de Clinical cytogeneticist.   No podemos decirle cul ser su copago por los medicamentos por adelantado ya que esto es diferente dependiendo de la cobertura de su seguro. Sin embargo, es  posible que podamos encontrar un medicamento sustituto a Audiological scientist un formulario para que el seguro cubra el medicamento que se considera necesario.   Si se requiere una autorizacin previa para que su compaa de seguros malta su medicamento, por favor permtanos de 1 a 2 das hbiles para completar este proceso.  Los precios de los medicamentos varan con frecuencia dependiendo del Environmental consultant de dnde se surte la receta y  alguna farmacias pueden ofrecer precios ms baratos.  El sitio web www.goodrx.com tiene cupones para medicamentos de Health and safety inspector. Los precios aqu no tienen en cuenta lo que podra costar con la ayuda del seguro (puede ser ms barato con su seguro), pero el sitio web puede darle el precio si no utiliz Tourist information centre manager.  - Puede imprimir el cupn correspondiente y llevarlo con su receta a la farmacia.  - Tambin puede pasar por nuestra oficina durante el horario de atencin regular y Education officer, museum una tarjeta de cupones de GoodRx.  - Si necesita que su receta se enve electrnicamente a una farmacia diferente, informe a nuestra oficina a travs de MyChart de [place] o por telfono llamando al [phone] y presione la opcin 4.

## 2023-12-21 ENCOUNTER — Encounter: Payer: Self-pay | Admitting: Dermatology

## 2024-02-04 ENCOUNTER — Encounter: Attending: Internal Medicine | Admitting: Dietician

## 2024-02-04 ENCOUNTER — Encounter: Payer: Self-pay | Admitting: Dietician

## 2024-02-04 VITALS — Ht 64.0 in | Wt 177.1 lb

## 2024-02-04 DIAGNOSIS — E669 Obesity, unspecified: Secondary | ICD-10-CM | POA: Insufficient documentation

## 2024-02-04 NOTE — Patient Instructions (Addendum)
 Begin to incorporate exercises from your handout 3-5 times per week for at least 15 minutes!  Move your walking pad to living room and spend 15 minutes walking while watching TV in the evening!  Continue to eat your largest meal for lunch and incorporate lean proteins as often as you can.  Try using Splenda in place of regular sugar when baking and use the equivalence chart on the box to use the appropriate amount.  Keep up the good work!!!

## 2024-02-04 NOTE — Progress Notes (Signed)
 Medical Nutrition Therapy  Appointment Start time:  [phone]  Appointment End time:  0905  Primary concerns today: Weight Loss, Improving health  Referral diagnosis: E66.811 (ICD-10-CM) - Obesity, class 1, E66.09 (ICD-10-CM) - Other obesity due to excess calories, E78.00 (ICD-10-CM) - Pure hypercholesterolemia, unspecified Preferred learning style: Auditory, visual, hands on Learning readiness: Change In Progress   NUTRITION ASSESSMENT   Anthropometrics: Ht: 64 Wt: 177.1 lbs BMI: 30.40 kg/m2 Wt Change: -8.3 lbs x2 months  Clinical Medical Hx: HLD, OSA, Obesity, Hypothyroidism, IBS, Lactose intolerance Medications: Rosuvastatin, Carbidopa/Levodopa Labs: Cholesterol - 211, LDL - 125, A1c - 5.5% Notable Signs/Symptoms: Fatigue   Lifestyle & Dietary Hx Pt reports concern over family history of heart disease and stroke, wants to stay as healthy as they can for as long as they can. Pt states that it has been difficult to maintain changes at times, but thet are determined. Pt reports eating salmon and vegetables for lunch most days, trying to eat their largest meal at lunch as well. Pt reports less night time snacking, drinking 2 40 oz cups of water daily. Pt reports getting a walking pad, has used it a few times and plans on using it regularly moving forward.   Estimated daily fluid intake: 48 oz Supplements: Women's MVI, Magnesium, B12, Iron+C Sleep: Always wakes up tired, OSA on CPAP, sleeps 7 hours Stress / self-care: Low overall, occasional minor work stress Current average weekly physical activity: ADLs, walks dog   24-Hr Dietary Recall First Meal: [employer] Snack:  Second Meal: Flounder, broccoli and cauliflower Snack: [employer] Protein bar Third Meal: Light n' Fit Greek Yogurt Snack:  Beverages: Water    NUTRITION DIAGNOSIS  NB-1.1 Food and nutrition-related knowledge deficit As related to obesity.  As evidenced by BMI of 31.82 kg/m2, history of  over consumption of energy dense foods/sweets.   NUTRITION INTERVENTION  Nutrition education (E-1) on the following topics:  Educated patient on the two components of energy balance: Energy in (calories), and energy out (activity). Explain the role of negative energy balance in weight loss. Discussed options with patient to achieve a negative energy balance and how to best control energy in and energy out to accommodate their lifestyle. Educate pt on the difference between LDL and HDL cholesterol.  Educate pt on the factors that can increase and decrease HDL cholesterol, including exercise to increase HDL, and tobacco use to decrease HDL.  Educate pt on factors that can elevate LDL cholesterol, including high dietary intake of saturated fats. Educate pt on identifying sources of saturated fats, and how to make alternative food choices to lower saturated fat intake. Educate pt on the role of soluble fiber in binding to cholesterol in the GI tract an eliminating it from the body. Educate pt on dietary sources of soluble fiber.  Educate on the role of elevated LDL,total cholesterol, and triglycerides on cardiovascular health. Educate pt on the role of physical activity in lowering LDL and increasing HDL cholesterol.  Handouts Provided Include  Balanced Plate Snack Sheet NEW: ADA Seated Exercise  Learning Style & Readiness for Change Teaching method utilized: Visual & Auditory  Demonstrated degree of understanding via: Teach Back  Barriers to learning/adherence to lifestyle change: None  Goals Established by Pt Begin to incorporate exercises from your handout 3-5 times per week for at least 15 minutes! Move your walking pad to living room and spend 15 minutes walking while watching TV in the evening! Continue to eat your largest meal for lunch  and incorporate lean proteins as often as you can. Try using Splenda in place of regular sugar when baking and use the equivalence chart on the box to use  the appropriate amount. Keep up the good work!!!   MONITORING & EVALUATION Dietary intake, weekly physical activity, and weight change in 2 months.  Next Steps  Patient is to follow up with RD.

## 2024-02-24 ENCOUNTER — Other Ambulatory Visit: Payer: Self-pay | Admitting: Internal Medicine

## 2024-02-24 DIAGNOSIS — Z1231 Encounter for screening mammogram for malignant neoplasm of breast: Secondary | ICD-10-CM

## 2024-04-07 ENCOUNTER — Encounter: Admitting: Dietician

## 2024-05-19 ENCOUNTER — Encounter: Admitting: Dietician

## 2024-05-29 ENCOUNTER — Ambulatory Visit: Admitting: Obstetrics & Gynecology

## 2024-12-25 ENCOUNTER — Ambulatory Visit: Admitting: Dermatology
